# Patient Record
Sex: Female | Born: 1970 | Hispanic: Yes | Marital: Married | Smoking: Never smoker
Health system: Southern US, Community
[De-identification: ages and names within clinical notes are randomized; demographics above are authoritative.]

## PROBLEM LIST (undated history)

## (undated) ENCOUNTER — Inpatient Hospital Stay (HOSPITAL_COMMUNITY): Payer: Self-pay

## (undated) DIAGNOSIS — N946 Dysmenorrhea, unspecified: Secondary | ICD-10-CM

## (undated) DIAGNOSIS — O24419 Gestational diabetes mellitus in pregnancy, unspecified control: Secondary | ICD-10-CM

## (undated) DIAGNOSIS — I10 Essential (primary) hypertension: Secondary | ICD-10-CM

## (undated) DIAGNOSIS — E119 Type 2 diabetes mellitus without complications: Secondary | ICD-10-CM

## (undated) DIAGNOSIS — K219 Gastro-esophageal reflux disease without esophagitis: Secondary | ICD-10-CM

## (undated) HISTORY — DX: Essential (primary) hypertension: I10

## (undated) HISTORY — DX: Dysmenorrhea, unspecified: N94.6

## (undated) HISTORY — PX: DILATION AND CURETTAGE OF UTERUS: SHX78

## (undated) HISTORY — PX: ABDOMINAL SURGERY: SHX537

## (undated) HISTORY — PX: COSMETIC SURGERY: SHX468

## (undated) HISTORY — PX: OOPHORECTOMY: SHX86

## (undated) HISTORY — PX: HERNIA REPAIR: SHX51

---

## 1998-05-28 ENCOUNTER — Other Ambulatory Visit: Admission: RE | Admit: 1998-05-28 | Discharge: 1998-05-28 | Payer: Self-pay | Admitting: Gynecology

## 1998-09-22 ENCOUNTER — Other Ambulatory Visit: Admission: RE | Admit: 1998-09-22 | Discharge: 1998-09-22 | Payer: Self-pay | Admitting: Gynecology

## 1998-10-07 ENCOUNTER — Encounter: Admission: RE | Admit: 1998-10-07 | Discharge: 1999-01-05 | Payer: Self-pay | Admitting: Gynecology

## 1999-01-27 ENCOUNTER — Encounter: Admission: RE | Admit: 1999-01-27 | Discharge: 1999-04-27 | Payer: Self-pay | Admitting: Gynecology

## 1999-02-19 ENCOUNTER — Inpatient Hospital Stay (HOSPITAL_COMMUNITY): Admission: AD | Admit: 1999-02-19 | Discharge: 1999-02-19 | Payer: Self-pay | Admitting: Gynecology

## 1999-03-06 ENCOUNTER — Inpatient Hospital Stay (HOSPITAL_COMMUNITY): Admission: AD | Admit: 1999-03-06 | Discharge: 1999-03-06 | Payer: Self-pay | Admitting: Gynecology

## 1999-03-14 ENCOUNTER — Inpatient Hospital Stay (HOSPITAL_COMMUNITY): Admission: AD | Admit: 1999-03-14 | Discharge: 1999-03-14 | Payer: Self-pay | Admitting: Obstetrics and Gynecology

## 1999-04-02 ENCOUNTER — Encounter (HOSPITAL_COMMUNITY): Admission: RE | Admit: 1999-04-02 | Discharge: 1999-04-16 | Payer: Self-pay | Admitting: *Deleted

## 1999-04-15 ENCOUNTER — Inpatient Hospital Stay (HOSPITAL_COMMUNITY): Admission: AD | Admit: 1999-04-15 | Discharge: 1999-04-19 | Payer: Self-pay | Admitting: Gynecology

## 1999-04-15 ENCOUNTER — Encounter (INDEPENDENT_AMBULATORY_CARE_PROVIDER_SITE_OTHER): Payer: Self-pay | Admitting: Specialist

## 1999-04-20 ENCOUNTER — Encounter: Admission: RE | Admit: 1999-04-20 | Discharge: 1999-07-19 | Payer: Self-pay | Admitting: Gynecology

## 1999-04-26 ENCOUNTER — Inpatient Hospital Stay (HOSPITAL_COMMUNITY): Admission: AD | Admit: 1999-04-26 | Discharge: 1999-04-26 | Payer: Self-pay | Admitting: Gynecology

## 1999-05-25 ENCOUNTER — Other Ambulatory Visit: Admission: RE | Admit: 1999-05-25 | Discharge: 1999-05-25 | Payer: Self-pay | Admitting: Gynecology

## 2000-05-18 ENCOUNTER — Other Ambulatory Visit: Admission: RE | Admit: 2000-05-18 | Discharge: 2000-05-18 | Payer: Self-pay | Admitting: Gynecology

## 2001-01-29 ENCOUNTER — Encounter: Admission: RE | Admit: 2001-01-29 | Discharge: 2001-04-29 | Payer: Self-pay | Admitting: Gynecology

## 2001-04-02 ENCOUNTER — Observation Stay (HOSPITAL_COMMUNITY): Admission: AD | Admit: 2001-04-02 | Discharge: 2001-04-03 | Payer: Self-pay | Admitting: Gynecology

## 2001-04-05 ENCOUNTER — Inpatient Hospital Stay (HOSPITAL_COMMUNITY): Admission: AD | Admit: 2001-04-05 | Discharge: 2001-04-05 | Payer: Self-pay | Admitting: *Deleted

## 2001-04-15 ENCOUNTER — Inpatient Hospital Stay (HOSPITAL_COMMUNITY): Admission: AD | Admit: 2001-04-15 | Discharge: 2001-04-18 | Payer: Self-pay | Admitting: Gynecology

## 2001-04-15 ENCOUNTER — Encounter (INDEPENDENT_AMBULATORY_CARE_PROVIDER_SITE_OTHER): Payer: Self-pay | Admitting: Specialist

## 2001-04-19 ENCOUNTER — Encounter: Admission: RE | Admit: 2001-04-19 | Discharge: 2001-05-19 | Payer: Self-pay | Admitting: Gynecology

## 2001-05-20 ENCOUNTER — Encounter: Admission: RE | Admit: 2001-05-20 | Discharge: 2001-06-19 | Payer: Self-pay | Admitting: Gynecology

## 2001-05-28 ENCOUNTER — Other Ambulatory Visit: Admission: RE | Admit: 2001-05-28 | Discharge: 2001-05-28 | Payer: Self-pay | Admitting: Gynecology

## 2001-06-20 ENCOUNTER — Encounter: Admission: RE | Admit: 2001-06-20 | Discharge: 2001-07-20 | Payer: Self-pay | Admitting: Gynecology

## 2001-08-18 ENCOUNTER — Encounter: Admission: RE | Admit: 2001-08-18 | Discharge: 2001-09-17 | Payer: Self-pay | Admitting: Gynecology

## 2001-10-18 ENCOUNTER — Encounter: Admission: RE | Admit: 2001-10-18 | Discharge: 2001-11-17 | Payer: Self-pay | Admitting: Gynecology

## 2007-12-09 ENCOUNTER — Encounter: Admission: RE | Admit: 2007-12-09 | Discharge: 2007-12-09 | Payer: Self-pay | Admitting: Neurology

## 2007-12-19 ENCOUNTER — Encounter: Admission: RE | Admit: 2007-12-19 | Discharge: 2008-01-31 | Payer: Self-pay | Admitting: Neurology

## 2008-11-19 ENCOUNTER — Emergency Department (HOSPITAL_COMMUNITY): Admission: EM | Admit: 2008-11-19 | Discharge: 2008-11-20 | Payer: Self-pay | Admitting: Emergency Medicine

## 2010-05-31 ENCOUNTER — Encounter: Payer: Self-pay | Admitting: Neurology

## 2010-09-24 NOTE — Discharge Summary (Signed)
[facility]  Patient:    Debbie Hebert, Debbie Hebert Visit Number: [id number] MRN: [id number]          Service Type: OBS Location: 910A 9117 01 Attending Physician:  Merrily Pew Dictated by:   Antony Contras, [facility] Admit Date:  04/15/2001 Discharge Date: 04/18/2001                             Discharge Summary  DISCHARGE DIAGNOSES:          1. Intrauterine pregnancy at 34 weeks.                               2. Gestational diabetes, insulin-dependent.                               3. Preterm labor.                               4. Preterm rupture of membrane.                               5. Previous cesarean section/desires repeat.  HISTORY OF PRESENT ILLNESS:   The patient is a 40 year old, gravida 2, para 1-0-0-1, with an last menstrual period of May 25, 2001 by ultrasound. Prenatal risk factors include a history of a previous cesarean section, gestational diabetes requiring insulin management.  The patient does request repeat cesarean section.  LABORATORY DATA:              Blood type A positive.  Antibody screen negative.  RPR and HBACG nonreactive.  Rubella immuned.  HOSPITAL COURSE:              The patient presented on April 15, 2001 with spontaneous rupture of membranes at 34 weeks.  Since she wished to proceed with cesarean section delivery the procedure was performed by Dr. Nadyne Coombes. Fontaine and assisted by scrub technician.  Findings included normal female infant.  Apgars 7 and 8.  Weight was 8 pounds 6 ounces.  Nuchal cord x 1.  Normal pelvic anatomy.  The patient remained afebrile and had no difficulty voiding.  She was able to be discharged on her third postoperative day in satisfactory condition.  CBC showed a hemoglobin of 30.4, hemoglobin 10.6, platelets 173,000.  DISCHARGE INSTRUCTIONS:       Follow up in six weeks.  DISCHARGE MEDICATIONS:        Continue with prenatal vitamins and iron. Motrin and Tylox for  pain. Dictated by:   Antony Contras, [facility] Attending Physician:  Merrily Pew DD:  05/23/01 TD:  05/23/01 Job: [id number]

## 2010-09-24 NOTE — Op Note (Signed)
[facility]  Patient:    Debbie Hebert, Debbie Hebert Visit Number: [id number] MRN: [id number]          Service Type: OBS Location: *N Attending Physician:  Tonye Royalty Dictated by:   Nadyne Coombes. Fontaine, M.D. Proc. Date: 04/15/01                             Operative Report  PREOPERATIVE DIAGNOSES:       1. Pregnancy at 34 weeks.                               2. Gestational diabetes, insulin dependent.                               3. Preterm labor.                               4. Preterm rupture of the membranes.                               5. Prior cesarean section, desires repeat                                  cesarean section.  POSTOPERATIVE DIAGNOSES:      1. Pregnancy at 34 weeks.                               2. Gestational diabetes, insulin dependent.                               3. Preterm labor.                               4. Preterm rupture of the membranes.                               5. Prior cesarean section, desires repeat                                  cesarean section.  PROCEDURE:                    Low transverse cervical cesarean section.  SURGEON:                      Timothy P. Fontaine, M.D.  ASSISTANT:                    Scrub technician.  ANESTHESIA:                   Regional.  ESTIMATED BLOOD LOSS:         Less than 500 cc.  COMPLICATIONS:                None.  SPECIMEN:  Samples of cord blood and placenta.  FINDINGS:                     At 2217, normal female infant, Apgars 7 and 8, weight pending. Nuchal cord x 1 noted. Normal pelvic anatomy noted.  DESCRIPTION OF PROCEDURE:     The patient was taken to the operating room, underwent regional anesthesia, and placed in the left tilt supine position, received an abdominal preparation with Betadine scrub and Betadine solution, bladder emptied with an indwelling Foley catheterization, and the patient was draped in the usual fashion.  After assuring adequate anesthesia, abdomen was sharply entered through a repeat Pfannenstiel incision achieving adequate hemostasis at all levels. Bladder flap was sharply and bluntly developed without difficulty and the uterus was sharply entered in the lower uterine segment and bluntly extended laterally. The bulging membranes were ruptured and the fluid was was noted to be clear. The infants head was delivered through the incision. Nuchal cord x 1 was reduced. The nares and mouth were suctioned. The rest of the infant delivered, the cord was doubly clamped and cut, and the infant was handed to pediatrics in attendance. Samples of cord blood were obtained. The placenta was then spontaneously extruded and noted to be intact and was sent to pathology. The uterus was exteriorized. The endometrial cavity was explored with a sponge to remove all placental and membrane fragments. The patient received IV antibiotic prophylaxis at this time. The uterine incision was then closed in one layer using 0 Vicryl suture in a running interlocking stitch and several figure-of-eight interrupted sutures were then played to achieve ultimate hemostasis. The uterus was then returned to the abdomen which was copiously irrigated noting adequate hemostasis and the anterior fascia was then reapproximated using 0 Vicryl suture, starting at the angles and meeting in the middle. Subcutaneous tissues were irrigated, adequate hemostasis was achieved with electrocautery, and the skin was reapproximated with staples. A sterile dressing was applied. The patient was taken to the recovery room in good condition having tolerated the procedure well. Dictated by:   Nadyne Coombes. Fontaine, M.D. Attending Physician:  Tonye Royalty DD:  04/15/01 TD:  04/15/01 Job: [id number]

## 2010-09-24 NOTE — Op Note (Signed)
[facility]  Patient:    Debbie Hebert                        MRN: [id number] Proc. Date: 04/16/99 Adm. Date:  [id number] Attending:  Tonye Royalty                           Operative Report  PREOPERATIVE DIAGNOSIS:       Intrauterine pregnancy at [redacted] weeks gestation. Spontaneous rupture of membranes.  Gestational diabetes - insulin dependent. Meconium stained amniotic fluid.  Chorioamnionitis.  Fetal tachycardia with possible arrhythmia.  POSTOPERATIVE DIAGNOSIS:      Intrauterine pregnancy at [redacted] weeks gestation. Spontaneous rupture of membranes.  Gestational diabetes - insulin dependent. Meconium stained amniotic fluid.  Chorioamnionitis.  Fetal tachycardia with possible arrhythmia.  OPERATION:                    Primary low transverse cesarean section.  SURGEON:                      Rudy Jew. Ashley Royalty, M.D.  ASSISTANT:  ANESTHESIA:                   Epidural.  FINDINGS:                     8 pound 2 ounce female, Apgars 8 at one minute and 9 at five minutes sent to the newborn nursery.  ESTIMATED BLOOD LOSS:         600 cc.  COMPLICATIONS:                None.  PACKS AND DRAINS:             Foley.  COUNTS:                       Sponge, needle, and instrument counts were correct x 2.  DESCRIPTION OF PROCEDURE:     The patient was taken to the operating room and placed in the dorsal supine position.  She was prepped and draped in the usual sterile fashion for abdominal surgery.  Foley catheter had been previously placed. After verifying surgical levels of anesthesia, the Pfannenstiel incision was made down to the level of the fascia.  The fascia was nicked with the knife and incised transversely with Mayo scissors.  Underlying rectus muscles were separated from the fascia using sharp and blunt dissection.  The rectus muscles were separated in he midline exposing the peritoneum which was elevated and entered  atraumatically with Metzenbaum scissors.  The incision was extended longitudinally.  The uterus was  identified and a bladder flap created by incising the anterior uterine serosa. The bladder was held inferiorly with the bladder blade out of harms way.  The uterus was then entered through a low transverse incision using sharp and blunt dissection.  The fluid was noted to be meconium stained as had previously been noted.  The infant was delivered from a vertex presentation.  At delivery of the head, DeLee suction was used to suction the airway.  Next the body was delivered and the cord was triply clamped, cut, and the infant given immediately to the awaiting pediatrics team.  Arterial cord pH was obtained from an isolated segment. Regular cord blood was obtained.  The placenta and membranes were removed in their entirety and submitted  to pathology for histologic studies.  The uterus was exteriorized.  The uterus was then closed in two running layers of #1 Vicryl. he first was a running locking layer.  The second was a running, intermittently locking, and imbricating layer.  Hemostasis was noted.  The uterus, tubes, and ovaries were found to be normal and returned to the abdominal cavity.  Copious irrigation was accomplished.  Next, the fascia was closed with 0 Vicryl in a running fashion.  The skin was closed with staples.  The patient tolerated the procedure extremely well and was returned to the recovery room in good condition. DD:  04/16/99 TD:  04/18/99 Job: [id number]

## 2010-09-24 NOTE — Discharge Summary (Signed)
[facility]  Patient:    JOHNITA, PALLESCHI Visit Number: [id number] MRN: [id number]          Service Type: OBS Location: 910A 9117 01 Attending Physician:  Merrily Pew Dictated by:   Antony Contras, [facility] Admit Date:  04/15/2001 Discharge Date: 04/18/2001                             Discharge Summary  DISCHARGE DIAGNOSES:          1. Intrauterine pregnancy at 32 weeks.                               2. History of gestational diabetes.                               3. Shortened cervix.                               4. Preterm labor.  HISTORY OF PRESENT ILLNESS:   The patient is a gravida 1 with [facility] March 25, 2002, estimated gestational age of [redacted] weeks with gestational diabetes and shortened cervix. The patient presented on April 02, 2001 for cervical length ultrasound and also nonstress test and was noted to be having some contractions. Apparently, she had much had much fluid to drink. She was admitted for observation and management of preterm labor symptoms.  HOSPITAL COURSE/TREATMENT:    The patient was admitted and was given subcutaneous terbutaline to which she responded. She was noted to have decrease of the contractions. was placed on Procardia 20 mg q.4h., and was able to be discharged on satisfactory condition on April 03, 2001.  DISPOSITION:                  The patient is to follow up in the office for next office visit. Dictated by:   Antony Contras, [facility] Attending Physician:  Merrily Pew DD:  06/15/01 TD:  06/17/01 Job: [phone] [id number]

## 2010-09-24 NOTE — H&P (Signed)
[facility]  Patient:    Debbie Hebert, Debbie Hebert Visit Number: [id number] MRN: [id number]          Service Type: OBS Location: *N Attending Physician:  Tonye Royalty Dictated by:   Nadyne Coombes. Fontaine, M.D. Adm. Date:  04/15/01                           History and Physical  CHIEF COMPLAINT:              Contractions.  HISTORY OF PRESENT ILLNESS:   A 40 year old G40, P55 female at [redacted] weeks gestation with history of preterm labor, currently on nifedipine, as well as gestational diabetes, insulin requiring and a prior low transverse cervical cesarean section, who plans on repeat cesarean section.  Patient notes since the a.m. of admission increasing regular contractions for which she ultimately presented to triage the evening of admission.  The patient was found to be contracting on a regular basis, every 3-5 minutes with a reactive fetal tracing with pelvic exam showing the internal cervical os to be closed; external os to be a fingertip dilated.  Patient received her expected nifedipine 20 mg dose, as well as IV hydration and she continued to contract with increasing frequency and discomfort and, ultimately, was admitted for IV tocolysis.  PAST MEDICAL HISTORY:         Insulin-dependent gestational diabetes, requiring p.m. NPH insulin at 12 units and 5 units of regular in the morning.  PAST SURGICAL HISTORY:        Cesarean section in 2000.  REVIEW OF SYSTEMS:            Noncontributory.  SOCIAL HISTORY:               Noncontributory.  FAMILY HISTORY:               Noncontributory.  ALLERGIES:                    None.  CURRENT MEDICATIONS:          1. Insulin.                               2. Prenatal vitamins.  PHYSICAL EXAMINATION:  HEENT:                        Normal.  LUNGS:                        Clear.  CARDIOVASCULAR:               Regular rate without rubs, murmurs or gallops.  ABDOMEN:                      Gravid,  vertex.  Uterus consistent with dates. Reactive fetal tracing on external monitor with regular uterine contractions every 3-5 minutes.  PELVIC EXAM:                  Deferred per nursing.  ASSESSMENT:                   1. [age]-year-old G2, P1 female, [redacted] weeks  gestation.                               2. Insulin-dependent gestational diabetes.                               3. History of preterm labor, now with regular                                  contractions.                               4. History of prior cesarean section with                                  planned repeat.                               5. For admission and IV tocolysis.  PLAN:                         Given the patients insulin-dependent diabetes, feel it most prudent to go ahead and tocolyse as she has failed IV hydration, as well as nifedipine oral dose.  CBC, urine analysis, beta strep vaginal culture ordered and pending at time of this dictation. Dictated by:   Nadyne Coombes. Fontaine, M.D. Attending Physician:  Tonye Royalty DD:  04/15/01 TD:  04/15/01 Job: [phone] [id number]

## 2012-04-12 ENCOUNTER — Other Ambulatory Visit (HOSPITAL_COMMUNITY)
Admission: RE | Admit: 2012-04-12 | Discharge: 2012-04-12 | Disposition: A | Discharge status: Home / Self Care | Payer: Self-pay | Source: Ambulatory Visit | Attending: Gynecology | Admitting: Gynecology

## 2012-04-12 ENCOUNTER — Encounter: Payer: Self-pay | Admitting: Gynecology

## 2012-04-12 ENCOUNTER — Ambulatory Visit (INDEPENDENT_AMBULATORY_CARE_PROVIDER_SITE_OTHER): Payer: Self-pay | Admitting: Gynecology

## 2012-04-12 ENCOUNTER — Ambulatory Visit: Payer: Self-pay | Admitting: Gynecology

## 2012-04-12 VITALS — BP 128/84 | Ht 61.25 in | Wt 185.0 lb

## 2012-04-12 DIAGNOSIS — Z01419 Encounter for gynecological examination (general) (routine) without abnormal findings: Secondary | ICD-10-CM

## 2012-04-12 DIAGNOSIS — Z1151 Encounter for screening for human papillomavirus (HPV): Secondary | ICD-10-CM | POA: Insufficient documentation

## 2012-04-12 DIAGNOSIS — N949 Unspecified condition associated with female genital organs and menstrual cycle: Secondary | ICD-10-CM

## 2012-04-12 DIAGNOSIS — R635 Abnormal weight gain: Secondary | ICD-10-CM

## 2012-04-12 DIAGNOSIS — R102 Pelvic and perineal pain: Secondary | ICD-10-CM

## 2012-04-12 DIAGNOSIS — I10 Essential (primary) hypertension: Secondary | ICD-10-CM | POA: Insufficient documentation

## 2012-04-12 DIAGNOSIS — N938 Other specified abnormal uterine and vaginal bleeding: Secondary | ICD-10-CM

## 2012-04-12 DIAGNOSIS — N925 Other specified irregular menstruation: Secondary | ICD-10-CM

## 2012-04-12 MED ORDER — MEDROXYPROGESTERONE ACETATE 10 MG PO TABS: ORAL_TABLET | ORAL | Status: DC

## 2012-04-12 NOTE — Progress Notes (Signed)
Debbie Hebert [birth year] [id number]   History:    41 y.o.  for annual gyn exam who has not been seen in the office in several years. Patient stated she had a Pap smear done 2 years ago in [place] was normal. She also stated that she had a laparotomy with right salpingo-oophorectomy for a benign adnexal mass. Patient would know prior history of mammograms. She in frequently does her self breast examination. Patient declined flu vaccine today. Her primary physician is Dr. Jordan Hawks who is been following her for hypertension and did her lab work recently. She stated that her cycles are regular but the past 2 months she spotted like for 1 day and between both cycles. Her spouse is using condoms for contraception. She's also been complaining of some left lower quadrant discomfort on and off for the past several months.  Past medical history,surgical history, family history and social history were all reviewed and documented in the EPIC chart.  Gynecologic History Patient's last menstrual period was 03/26/2012. Contraception: condoms Last Pap: 2 years ago in [place]. Results were: No report available patient states negative Last mammogram: No prior study. Results were: No prior study  Obstetric History OB History    Grav Para Term Preterm Abortions TAB SAB Ect Mult Living   2 2 1 1      2      # Outc Date GA Lbr Len/2nd Wgt Sex Del Anes PTL Lv   1 TRM     F CS  No Yes   2 PRE     M CS  Yes Yes       ROS: A ROS was performed and pertinent positives and negatives are included in the history.  GENERAL: No fevers or chills. HEENT: No change in vision, no earache, sore throat or sinus congestion. NECK: No pain or stiffness. CARDIOVASCULAR: No chest pain or pressure. No palpitations. PULMONARY: No shortness of breath, cough or wheeze. GASTROINTESTINAL: No abdominal pain, nausea, vomiting or diarrhea, melena or bright red blood per rectum. GENITOURINARY: No urinary frequency, urgency, hesitancy or dysuria.  MUSCULOSKELETAL: No joint or muscle pain, no back pain, no recent trauma. DERMATOLOGIC: No rash, no itching, no lesions. ENDOCRINE: No polyuria, polydipsia, no heat or cold intolerance. No recent change in weight. HEMATOLOGICAL: No anemia or easy bruising or bleeding. NEUROLOGIC: No headache, seizures, numbness, tingling or weakness. PSYCHIATRIC: No depression, no loss of interest in normal activity or change in sleep pattern.     Exam: chaperone present  BP 128/84  Ht 5' 1.25" (1.556 m)  Wt 185 lb (83.915 kg)  BMI 34.67 kg/m2  LMP 03/26/2012  Body mass index is 34.67 kg/(m^2).  General appearance : Well developed well nourished female. No acute distress HEENT: Neck supple, trachea midline, no carotid bruits, no thyroidmegaly Lungs: Clear to auscultation, no rhonchi or wheezes, or rib retractions  Heart: Regular rate and rhythm, no murmurs or gallops Breast:Examined in sitting and supine position were symmetrical in appearance, no palpable masses or tenderness,  no skin retraction, no nipple inversion, no nipple discharge, no skin discoloration, no axillary or supraclavicular lymphadenopathy Abdomen: no palpable masses or tenderness, no rebound or guarding Extremities: no edema or skin discoloration or tenderness  Pelvic:  Bartholin, Urethra, Skene Glands: Within normal limits             Vagina: No gross lesions or discharge  Cervix: No gross lesions or discharge  Uterus limited exam due to patient's abdominal girth and complaining of left lower quadrant  discomfort  Adnexa  as described above  Anus and perineum  normal   Rectovaginal  normal sphincter tone without palpated masses or tenderness             Hemoccult not done     Assessment/Plan:  41 y.o. female for annual exam be scheduled to return back to the office for a pelvic ultrasound for better assessment of her left adnexa. She will be started on Provera 10 mg to take 1 by mouth daily for 10 days of each month for the next  3 months and then to return to the office for followup in 4 months. She will maintain menstrual calendar. If she continues to have any form of irregular bleeding we'll proceed with an endometrial biopsy. A requisition was given to her to schedule her mammogram. Literature formation on self breast examination was provided in Spanish. Patient declined flu vaccine today. Pap smear was done today.    Ok Edwards MD, 5:29 PM 04/12/2012

## 2012-04-12 NOTE — Patient Instructions (Addendum)
Autoexamen de [employer]  Chief Executive Officer) El autoexamen de mamas puede detectar problemas de manera temprana, prevenir complicaciones mdicas significativas y posiblemente salvar su vida. Al hacerlo, podr familiarizarse con el aspecto y forma de sus [place], y observar cambios. Esto le permite descubrir cambios de manera precoz. Este autoexamen [employer] ofrece la tranquilidad de que sus senos estn en buen [place] de [place]. Una forma de aprender qu es normal para sus mamas y si sufren modificaciones es Radio producer un autoexamen.  Si encuentra un bulto o algo que no estaba presente anteriormente, lo mejor es ponerse en contacto con su mdico inmediatamente. Otro hallazgo que deben ser evaluados por su mdico es la secrecin del pezn, especialmente si es con sangre, cambios en la piel o enrojecimiento; reas donde la piel parece estar tironeada (retrada) o nuevos bultos o protuberancias. El dolor en los senos es rara vez se asocia con el cncermalignidad), pero tambin debe ser evaluado por un mdico. .  AUTOEXAMEN DE MAMAS  El mejor momento para examinar sus mamas es a los 5 a 7 das despus de finalizado el perodo menstrual. Durante la menstruacin, las mamas estn ms abultadas y puede haber ms dificultad para Clinical research associate modificaciones. Si no menstra, ha llegado a la menopausia, o le han extirpado el tero (histerectomia), usted debe examinar sus senos a intervalos regulares, por ejemplo cada mes. Si est amamantando, examine sus senos despus de alimentar al beb o despus de usar un extractor de Novelty. Los implantes mamarios no disminuyen el riesgo de bultos o tumores, por lo que debe seguir realizando el autoexamen de [employer] se recomienda. Hable con su mdico acerca de cmo determinar la diferencia entre el implante y el tejido [place]. Adems, debe consultar cuanta presin debe hacer durante el examen. Con el tiempo se familiarizar con las variaciones de las mamas y se sentir ms cmoda para  Horticulturist, commercial. Para el autoexamen deber quitarse toda la ropa de la cintura para [place].   Observe sus senos y pezones. Prese frente a un espejo en una habitacin con buena iluminacin. Con las [employer] caderas, presione las manos firmemente [place]. Busque diferencias en la forma, el contorno y el tamao de un pecho al otro (asimetras).Eusebio Me las asimetras se incluyen arrugas, depresiones o protuberancias. Tambin, busque cambios en la piel, como reas enrojecidas o escamosas. Busque cambios en los pezones, como secreciones, hoyuelos, cambios en la posicin, o enrojecimiento.  [place] cuidadosamente sus senos. Es mucho mejor [employer] en la ducha o en la baera, [place] jabn o cuando est recostada sobre su espalda. Coloque el brazo (en el lado de la mama que se examina) por arriba de la cabeza. Use las yemas (no las puntas) de los tres dedos centrales de la mano opuesta para palpar. Comience en la zona de la axila, haga crculos de  de pulgada (2 cm) y vaya superponindolos. Utilice 3 niveles diferentes de presin (ligero, medio y [place]) en cada crculo antes de pasar al siguiente. Se necesita una presin ligera para sentir los tejidos ms cercanos a la piel. La presin media ayudar a sentir el tejido [employer] un poco ms profundo, mientras que se necesita una presin firme para palpar el tejido que se encuentra cerca de las Markesan. Continuar superponiendo crculos y vaya hacia abajo, hasta sentir las Clutier, por debajo del [place]. Luego mueva un espacio del ancho de un dedo hacia el centro del cuerpo. Siga con los crculos del  de pulgada (2 cm) mientras va lentamente  hacia la clavcula, cerca de la base del cuello. Contine con el examen hacia arriba y hacia abajo con las 3 intensidades de presin Civil Service fast streamer a la mitad del pecho. Hgalo con cada seno cuidadosamente, buscando bultos o modificaciones.  Debe llevar un registro escrito con los cambios o los hallazgos normales que  encuentre para cada seno. Si registra esta informacin, no tiene que depender slo de la memoria para Designer, industrial/product, la sensibilidad o la ubicacin de los [place]. Anote en qu momento se encuentra del ciclo menstrual, si usted todava est menstruando.  El tejido [employer] puede tener algunos bultos o tejidos engrosados. Sin embargo, consulte a su mdico si usted Animal nutritionist.   SOLICITE ATENCIN MDICA SI:   Observa cambios en la forma, en el contorno o el tamao de las mamas o los pezones.   Hay modificaciones en la piel, como zonas enrojecidas o escamosas en las mamas o en los pezones.   Tiene una secrecin anormal en los pezones.   Siente un nuevo bulto o reas engrosadas de [place] anormal.  Document Released: 04/25/2005 Document Revised: 10/25/2011 [facility] Patient Information 2013 [place], [place].  Ejercicios para perder peso (Exercise to Lose Weight) La actividad fsica y Neomia Dear dieta saludable ayudan a perder peso. El mdico podr sugerirle ejercicios especficos. IDEAS Y CONSEJOS PARA HACER EJERCICIOS  Elija opciones econmicas que disfrute hacer , como caminar, andar en bicicleta o los vdeos para ejercitarse.   Utilice las Microbiologist del ascensor.   Camine durante la hora del almuerzo.   Estacione el auto lejos del lugar de [place] o [place].   Concurra a un gimnasio o tome clases de gimnasia.   Comience con 5  10 minutos de actividad fsica por da. Ejercite hasta 30 minutos, 4 a 6 das por [address]   Utilice zapatos que tengan un buen soporte y ropas cmodas.   Elongue antes y despus de Company secretary.   Ejercite hasta que aumente la respiracin y el corazn palpite rpido.   Beba agua extra cuando ejercite.   No haga ejercicio Firefighter, sentirse mareado o que le falte mucho el aire.  La actividad fsica puede quemar alrededor de 150 caloras.  Correr 20 cuadras en 15 minutos.   Jugar vley durante 45 a 60 minutos.    Limpiar y encerar el auto durante 45 a 60 minutos.   Jugar ftbol americano de toque.   Caminar 25 cuadras en 35 minutos.   Empujar un cochecito 20 cuadras en 30 minutos.   Jugar baloncesto durante 30 minutos.   Rastrillar hojas secas durante 30 minutos.   Andar en bicicleta 80 cuadras en 30 minutos.   Caminar 30 cuadras en 30 minutos.   Bailar durante 30 minutos.   Quitar la nieve con una pala durante 15 minutos.   Nadar vigorosamente durante 20 minutos.   Subir escaleras durante 15 minutos.   Andar en bicicleta 60 cuadras durante 15 minutos.   Arreglar el jardn entre 30 y 45 minutos.   Saltar a la soga durante 15 minutos.   Limpiar vidrios o pisos durante 45 a 60 minutos.  Document Released: 07/30/2010 Document Revised: 01/05/2011 [facility] Patient Information 2012 [place], [place].                                                   Control  del colesterol  Los niveles de colesterol en el organismo estn determinados significativamente por su dieta. Los niveles de colesterol tambin se relacionan con la enfermedad cardaca. El material que sigue ayuda a Software engineer relacin y a Chiropractor qu puede hacer para mantener su corazn sano. No todo el colesterol es [place]. Las lipoprotenas de baja densidad (LDL) forman el colesterol "malo". El colesterol malo puede ocasionar depsitos de grasa que se acumulan en el interior de las arterias. Las lipoprotenas de alta densidad (HDL) es el colesterol "bueno". Ayuda a remover el colesterol LDL "malo" de la [place]. El colesterol es un factor de riesgo muy importante para la enfermedad cardaca. Otros factores de riesgo son la hipertensin arterial, el hbito de fumar, el estrs, la herencia y [place].  El msculo cardaco obtiene el suministro de sangre a travs de las arterias coronarias. Si su colesterol LDL ("malo") est elevado y el HDL ("bueno") es bajo, tiene un factor de riesgo para que se formen depsitos de Holiday representative en las arterias  coronarias (los vasos sanguneos que suministran sangre al corazn). Esto hace que haya menos lugar para que la sangre circule. Sin la suficiente sangre y oxgeno, el msculo cardaco no puede funcionar correctamente, y usted podr sentir dolores en el pecho (angina pectoris). Cuando una arteria coronaria se cierra completamente, una parte del msculo cardaco puede morir (infarto de miocardio). CONTROL DEL COLESTEROL Cuando el profesional que lo asiste enva la sangre al laboratorio para Artist nivel de colesterol, puede realizarle tambin un perfil completo de los lpidos. Con esta prueba, se puede determinar la cantidad total de colesterol, as como los niveles de LDL y HDL. Los triglicridos son un tipo de grasa que circula en la sangre y que tambin puede utilizarse para determinar el riesgo de enfermedad cardaca. En la siguiente tabla se establecen los nmeros ideales: Prueba: Colesterol total  Menos de 200 mg/dl.  Prueba: LDL "colesterol malo"  Menos de 100 mg/dl.   Menos de 70 mg/dl si tiene riesgo muy elevado de sufrir un ataque cardaco o muerte cardaca sbita.  Prueba: HDL "colesterol bueno"  Mujeres: Ms de 50 mg/dl.   Hombres: Ms de 40 mg/dl.  Prueba: Trigliceridos  Menos de 150 mg/dl.  CONTROL DEL COLESTEROL CON DIETA Aunque factores como el ejercicio y el estilo de vida son importantes, la "primera lnea de ataque" es la dieta. Esto se debe a que se sabe que ciertos alimentos hacen subir el colesterol y otros lo [place]. El objetivo debe ser [employer] alimentos, de modo que tengan un efecto sobre el colesterol y, an ms importante, Microbiologist las grasas saturadas y trans con otros tipos de grasas, como las monoinsaturadas y las poliinsaturadas y cidos grasos omega-3 . En promedio, una persona no debe consumir ms de 15 a 17 g de grasas saturadas por [employer]. Las grasas saturadas y trans se consideran grasas "malas", ya que elevan el colesterol LDL. Las grasas saturadas se  encuentran principalmente en productos animales como carne, [place] y crema. Pero esto no significa que usted Marketing executive todas sus comidas favoritas. Actualmente, como lo muestra el cuadro que figura al final de este documento, hay sustitutos de buen sabor, bajos en grasas y en colesterol, para la mayora de los alimentos que a usted Musician. Elija aquellos alimentos alternativos que sean bajos en grasas o sin grasas. Elija cortes de carne del cuarto trasero o lomo ya que estos cortes son los que tienen menor cantidad de grasa y Oncologist. El pollo (  sin piel), el pescado, la carne de ternera, y la [place] molida son excelentes opciones. Elimine las carnes [employer] o el salami. Los Federal[employer]Mogul o nada de grasas saturadas. Cuando consuma carne [place], carne de aves de corral, o pescado, hgalo en porciones de 85 gramos (3 onzas). Las grasas trans tambin se llaman "aceites parcialmente hidrogenados". Son aceites manipulados cientficamente de [place] que son slidos a Publishing rights manager, tienen una larga vida y Glass blower/designer sabor y la textura de los alimentos a los que se Scientist, clinical (histocompatibility and immunogenetics). Las grasas trans se encuentran en la [place], [place], crackers y alimentos horneados.  Para hornear y cocinar, el aceite es un excelente sustituto para la [place]. Los aceites monoinsaturados tienen un beneficio particular, ya que se cree que disminuyen el colesterol LDL (colesterol malo) y elevan el HDL. Deber evitar los aceites tropicales saturados como el de coco y el de [place].  Recuerde, adems, que puede comer sin restricciones los grupos de alimentos que son naturalmente libres de grasas saturadas y Neurosurgeon trans, entre los que se incluyen el pescado, las frutas (excepto el [place]), verduras, frijoles, cereales (cebada, arroz, [place], trigo) y las pastas (sin salsas con crema)  IDENTIFIQUE LOS ALIMENTOS QUE DISMINUYEN EL COLESTEROL  Pueden disminuir el colesterol las fibras  solubles que estn en las frutas, como las [place], en los vegetales como el brcoli, las patatas y las zanahorias; en las legumbres como frijoles, guisantes y Therapist, occupational; y en los cereales como la cebada. Los alimentos fortificados con fitosteroles tambin Engineer, production. Debe consumir al menos 2 g de estos alimentos a diario para Financial planner de disminucin de [place].  En el supermercado, lea las etiquetas de los envases para identificar los alimentos bajos en grasas saturadas, libres de grasas trans y bajos en [place], . Elija quesos que tengan solo de 2 a 3 g de grasa saturada por onza (28,35 g). Use una margarina que no dae el corazn, [place] de grasas trans o aceite parcialmente hidrogenado. Al comprar alimentos horneados (galletitas dulces y Gaffer) evite el aceite parcialmente hidrogenado. Los panes y bollos debern ser de granos enteros (harina de maz o de avena entera, en lugar de "harina" o "harina enriquecida"). Compre sopas en lata que no sean cremosas, con bajo contenido de sal y sin grasas adicionadas.  TCNICAS DE PREPARACIN DE LOS ALIMENTOS  Nunca fra los alimentos en aceite abundante. Si debe frer, hgalo en poco aceite y removiendo [place], porque as se utilizan muy pocas grasas, o utilice un spray antiadherente. Cuando le sea posible, hierva, hornee o ase las carnes y cocine los vegetales al vapor. En vez de [employer] con mantequilla o [place], utilice limn y hierbas, pur de Psychologist, educational y canela (para las calabazas y batatas), yogurt y salsa descremados y aderezos para ensaladas bajos en contenido graso.  BAJO EN GRASAS SATURADAS / SUSTITUTOS BAJOS EN GRASA  Carnes / Grasas saturadas (g)  Evite: Bife, corte graso (3 oz/85 g) / 11 g   Elija: Bife, corte magro (3 oz/85 g) / 4 g   Evite: Hamburguesa (3 oz/85 g) / 7 g   Elija:  [place]amb[place] magra (3 oz/85 g) / 5 g   Evite: Jamn (3 oz/85 g) / 6 g   Elija:  Jamn magro (3 oz/85 g) /  2.4 g   Evite: Pollo, con piel (3 oz/85 g), Carne oscura / 4 g   Elija:  Pollo, sin piel (3 oz/85 g), Carne oscura / 2  g   Evite: Pollo, con piel (3 oz/85 g), Carne magra / 2.5 g   Elija: Pollo, sin piel (3 oz/85 g), Carne magra / 1 g  Lcteos / Grasas saturadas (g)  Evite: Leche entera (1 taza) / 5 g   Elija: Leche con bajo contenido de grasa, 2% (1 taza) / 3 g   Elija: Leche con bajo contenido de grasa, 1% (1 taza) / 1.5 g   Elija: Leche descremada (1 taza) / 0.3 g   Evite: Queso duro (1 oz/28 g) / 6 g   Elija: Queso descremado (1 oz/28 g) / 2-3 g   Evite: Queso cottage, 4% grasa (1 taza)/ 6.5 g   Elija: Queso cottage con bajo contenido de grasa, 1% grasa (1 taza)/ 1.5 g   Evite: Helado (1 taza) / 9 g   Elija: Sorbete (1 taza) / 2.5 g   Elija: Yogurt helado sin contenido de grasa (1 taza) / 0.3 g   Elija: Barras de fruta congeladas / vestigios   Evite: Crema batida (1 cucharada) / 3.5 g   Elija: Batidos glac sin lcteos (1 cucharada) / 1 g  Condimentos / Grasas saturadas (g)  Evite: Mayonesa (1 cucharada) / 2 g   Elija: Mayonesa con bajo contenido de grasa (1 cucharada) / 1 g   Evite: Manteca (1 cucharada) / 7 g   Elija: Margarina extra light (1 cucharada) / 1 g   Evite: Aceite de coco (1 cucharada) / 11.8 g   Elija: Aceite de oliva (1 cucharada) / 1.8 g   Elija: Aceite de maz (1 cucharada) / 1.7 g   Elija: Aceite de crtamo (1 cucharada) / 1.2 g   Elija: Aceite de girasol (1 cucharada) / 1.4 g   Elija: Aceite de soja (1 cucharada) / 2.4 g   Elija: Aceite de canola (1 cucharada) / 1 g  Document Released: 04/25/2005 Document Revised: 01/05/2011 [facility] Patient Information 2012 [place], [place].  Ecografa transvaginal (Transvaginal Ultrasound) La ecografa transvaginal es una ecografa plvica en la que se utiliza una probeta metlica que se coloca en la vagina, para observar los rganos femeninos. El ecgrafo enva ondas sonoras desde un  transductor (sonda). Estas ondas sonoras chocan contra las estructuras del cuerpo (como un eco) y crean Naval architect. La imagen se observa en un monitor. Se denomina transvaginal debido a que la sonda se inserta dentro de la vagina. Puede haber una pequea molestia por la introduccin de la sonda. Esta prueba tambin puede realizarse [employer]. La ecografa endovaginal es otro nombre para la ecografa transvaginal. En una ecografa transabdominal, la sonda se coloca en la parte externa del abdomen. Este mtodo no ofrece imgenes tan buenas como la tcnica transvaginal. La ecogafa transvaginal se utiliza para observar alteraciones en el tracto genital femenino. Entre ellos se incluyen:  Problemas de infertilidad.  Malformaciones congnitas (defecto de nacimiento) del tero y los ovarios.  Tumores en el tero.  Hemorragias anormales.  Tumores y quistes de ovario.  Abscesos (tejidos inflamados y pus) en la pelvis.  Dolor abdominal o plvico sin causa aparente.  Infecciones plvicas. DURANTE EL EMBARAZO, SE UTILIZA PAR OBSERVAR:  Embarazos normales.  Un embarazo ectpico (embarazo fuera del tero).  Latidos cardacos fetales.  Anormalidades de la pelvis que no se observan bien con la ecografa transabdominal.  Sospecha de gemelos o embarazo mltiple.  Aborto inminente.  Problemas en el cuello del tero (cuello incompetente, no permanece cerrado para contener al beb).  Cuando se Mearl Latin amniocentesis (  se retira lquido de la bolsa amnitica, para ser [place]).  Al buscar anormalidades en el beb.  Para controlar el crecimiento, el desarrollo y la edad del feto.  Para medir la cantidad de lquido en el saco amnitico.  Cuando se realiza una versin externa del beb (se lo mueve a Loss adjuster, chartered).  Evaluar al beb en embarazos de alto riesgo (perfil biofsico).  Si se sospecha el deceso del beb (muerte). En algunos casos, se utiliza un mtodo especial  denominado ecografa con infusin salina, para una observacin ms precisa del tero. Se inyecta solucin salina (agua con sal) dentro del tero en pacientes no embarazadas para observar mejor su interior. Este mtodo no se Glass blower/designer. La probeta tambin puede usarse para obtener biopsias de Insurance claims handler, para drenar lquido de quistes de ovario y para Editor, commissioning un DIU (dispositivo intrauterino para el control de la natalidad) que no pueda [place]. PREPARACIN PARA LA PRUEBA La ecografa transvaginal se realiza con la vejiga vaca. La ecografa transabdominal se realiza con la vejiga llena. Podrn solicitarle que beba varios vasos de agua antes del examen. En algunos casos se realiza una ecografa transabdominal antes de la ecografa transvaginal para obervar los rganos del abdomen. PROCEDIMIENTO  Deber acostarse en una cama, con las rodillas dobladas y los pies en los estribos. La probeta se cubre con un condn. Dentro de la vagina y en la probeta se aplica un lubricante estril. El lubricante ayuda a transmitir las ondas sonoras y Nature conservation officer la irritacin de la vagina. El mdico mover la sonda en el interior de la cavidad vaginal para escanear las estructuras plvicas. Un examen normal mostrar una pelvis normal y contenidos normales en su interior. Una prueba anormal mostrar anormalidades en la pelvis, la placenta o el beb. LAS CAUSAS DE UN RESULTADO ANORMAL PUEDEN SER:  Crecimientos o tumores en:  El tero.  Los ovarios.  La vagina.  Otras estructuras plvicas.  Crecimientos no cancerosos en el tero y los ovarios.  El ovario se retuerce y se corta el suministro de [place] (torsin [place]).  Las reas de infeccin incluyen:  Enfermedad inflamatoria plvica.  Un absceso en la pelvis.  Ubicacin de un DIU. LOS PROBLEMAS QUE PUEDEN HALLARSE EN UNA MUJER EMBARAZADA SON:  Embarazo ectpico (embarazo fuera del tero).  Embarazos mltiples.  Dilatacin (apertura)  precoz anormal del cuello del tero. Esto puede indicar un cuello incompetente y Surveyor, mining.  Aborto inminente.  Muerte fetal.  Los problemas con la placenta incluyen:  La placenta se ha desarrollado sobre la abertura del cuello del tero (placenta previa).  La placenta se ha separado anticipadamente en el tero (abrupcin placentaria).  La placenta se desarrolla en el msculo del tero (placenta acreta).  Tumores del Psychiatrist, incluyendo la enfermedad trofoblstica gestacional. Se trata de un embarazo anormal en el que no hay feto. El tero se llena de quistes similares a uvas que en algunos casos son cancerosos.  Posicin incorrecta del feto (de nalgas, de vrtice).  Retraso del desarrollo fetal intrauterino (escaso desarrollo en el tero).  Anormalidades o infeccin fetal. RIESGOS Y COMPLICACIONES No hay riesgos conocidos para la ecografa. No se toman radiografas cuando se realiza una ecografa. Document Released: 08/11/2008 Document Revised: 07/18/2011 [facility] Patient Information 2013 [place], [place].

## 2012-04-13 ENCOUNTER — Ambulatory Visit: Payer: Self-pay | Admitting: Internal Medicine

## 2012-04-13 VITALS — BP 105/71 | HR 86 | Temp 98.0°F | Resp 17 | Wt 183.0 lb

## 2012-04-13 DIAGNOSIS — K529 Noninfective gastroenteritis and colitis, unspecified: Secondary | ICD-10-CM

## 2012-04-13 DIAGNOSIS — R109 Unspecified abdominal pain: Secondary | ICD-10-CM

## 2012-04-13 DIAGNOSIS — R1110 Vomiting, unspecified: Secondary | ICD-10-CM

## 2012-04-13 DIAGNOSIS — R1013 Epigastric pain: Secondary | ICD-10-CM

## 2012-04-13 DIAGNOSIS — A09 Infectious gastroenteritis and colitis, unspecified: Secondary | ICD-10-CM

## 2012-04-13 LAB — POCT CBC
Granulocyte percent: 82.3 % — AB (ref 37–80)
HCT, POC: 44.5 % (ref 37.7–47.9)
Hemoglobin: 13.9 g/dL (ref 12.2–16.2)
Lymph, poc: 1.1 (ref 0.6–3.4)
MCH, POC: 30.6 pg (ref 27–31.2)
MCHC: 31.2 g/dL — AB (ref 31.8–35.4)
MCV: 98 fL — AB (ref 80–97)
MID (cbc): 0.4 (ref 0–0.9)
MPV: 10.4 fL (ref 0–99.8)
POC Granulocyte: 6.9 (ref 2–6.9)
POC LYMPH PERCENT: 12.9 % (ref 10–50)
POC MID %: 4.8 % (ref 0–12)
Platelet Count, POC: 206 10*3/uL (ref 142–424)
RBC: 4.54 M/uL (ref 4.04–5.48)
RDW, POC: 12.6 %
WBC: 8.4 10*3/uL (ref 4.6–10.2)

## 2012-04-13 LAB — POCT URINALYSIS DIPSTICK
Bilirubin, UA: NEGATIVE
Blood, UA: NEGATIVE
Glucose, UA: NEGATIVE
Ketones, UA: NEGATIVE
Leukocytes, UA: NEGATIVE
Nitrite, UA: NEGATIVE
Spec Grav, UA: >=1.03
Urobilinogen, UA: 0.2
pH, UA: 6

## 2012-04-13 LAB — POCT UA - MICROSCOPIC ONLY
Bacteria, U Microscopic: NEGATIVE
Casts, Ur, LPF, POC: NEGATIVE
Crystals, Ur, HPF, POC: NEGATIVE
Mucus, UA: NEGATIVE
RBC, urine, microscopic: NEGATIVE
Yeast, UA: NEGATIVE

## 2012-04-13 MED ORDER — HYDROCODONE-ACETAMINOPHEN 5-325 MG PO TABS: ORAL_TABLET | ORAL | Status: DC

## 2012-04-13 MED ORDER — ONDANSETRON HCL 8 MG PO TABS: 8.0000 mg | ORAL_TABLET | Freq: Three times a day (TID) | ORAL | Status: DC | PRN

## 2012-04-13 NOTE — Patient Instructions (Addendum)
Viral Gastroenteritis Viral gastroenteritis is also known as stomach flu. This condition affects the stomach and intestinal tract. It can cause sudden diarrhea and vomiting. The illness typically lasts 3 to 8 days. Most people develop an immune response that eventually gets rid of the virus. While this natural response develops, the virus can make you quite ill. CAUSES  Many different viruses can cause gastroenteritis, such as rotavirus or noroviruses. You can catch one of these viruses by consuming contaminated food or water. You may also catch a virus by sharing utensils or other personal items with an infected person or by touching a contaminated surface. SYMPTOMS  The most common symptoms are diarrhea and vomiting. These problems can cause a severe loss of body fluids (dehydration) and a body salt (electrolyte) imbalance. Other symptoms may include:  Fever.  Headache.  Fatigue.  Abdominal pain. DIAGNOSIS  Your caregiver can usually diagnose viral gastroenteritis based on your symptoms and a physical exam. A stool sample may also be taken to test for the presence of viruses or other infections. TREATMENT  This illness typically goes away on its own. Treatments are aimed at rehydration. The most serious cases of viral gastroenteritis involve vomiting so severely that you are not able to keep fluids down. In these cases, fluids must be given through an intravenous line (IV). HOME CARE INSTRUCTIONS   Drink enough fluids to keep your urine clear or pale yellow. Drink small amounts of fluids frequently and increase the amounts as tolerated.  Ask your caregiver for specific rehydration instructions.  Avoid:  Foods high in sugar.  Alcohol.  Carbonated drinks.  Tobacco.  Juice.  Caffeine drinks.  Extremely hot or cold fluids.  Fatty, greasy foods.  Too much intake of anything at one time.  Dairy products until 24 to 48 hours after diarrhea stops.  You may consume probiotics.  Probiotics are active cultures of beneficial bacteria. They may lessen the amount and number of diarrheal stools in adults. Probiotics can be found in yogurt with active cultures and in supplements.  Wash your hands well to avoid spreading the virus.  Only take over-the-counter or prescription medicines for pain, discomfort, or fever as directed by your caregiver. Do not give aspirin to children. Antidiarrheal medicines are not recommended.  Ask your caregiver if you should continue to take your regular prescribed and over-the-counter medicines.  Keep all follow-up appointments as directed by your caregiver. SEEK IMMEDIATE MEDICAL CARE IF:   You are unable to keep fluids down.  You do not urinate at least once every 6 to 8 hours.  You develop shortness of breath.  You notice blood in your stool or vomit. This may look like coffee grounds.  You have abdominal pain that increases or is concentrated in one small area (localized).  You have persistent vomiting or diarrhea.  You have a fever.  The patient is a child younger than 3 months, and he or she has a fever.  The patient is a child older than 3 months, and he or she has a fever and persistent symptoms.  The patient is a child older than 3 months, and he or she has a fever and symptoms suddenly get worse.  The patient is a baby, and he or she has no tears when crying. MAKE SURE YOU:   Understand these instructions.  Will watch your condition.  Will get help right away if you are not doing well or get worse. Document Released: 04/25/2005 Document Revised: 07/18/2011 Document Reviewed: 02/09/2011   ExitCare Patient Information 2013 [place], [place]. Gastroenteritis viral (Viral Gastroenteritis) La gastroenteritis viral tambin es conocida como gripe del [place]. Este trastorno [employer] y el tubo digestivo. Puede causar diarrea y vmitos repentinos. La enfermedad generalmente dura entre 3 y [address]. La [employer] de las  personas desarrolla una respuesta inmunolgica. Con el tiempo, esto elimina el virus. Mientras se desarrolla esta respuesta natural, el virus puede afectar en forma importante su salud.  CAUSAS Muchos virus diferentes pueden causar gastroenteritis, por ejemplo el rotavirus o el norovirus. Estos virus pueden contagiarse al consumir alimentos o agua contaminados. Tambin puede contagiarse al compartir utensilios u otros artculos personales con una persona infectada o al tocar una superficie contaminada.  SNTOMAS Los sntomas ms comunes son diarrea y vmitos. Estos problemas pueden causar una prdida grave de lquidos corporales(deshidratacin) y un desequilibrio de sales corporales(electrolitos). Otros sntomas pueden ser:   Grant Ruts.  Dolor de [place].  Fatiga.  Dolor abdominal. DIAGNSTICO  El mdico podr hacer el diagnstico de gastroenteritis viral basndose en los sntomas y el examen fsico Tambin pueden tomarle una muestra de materia fecal para diagnosticar la presencia de virus u otras infecciones.  TRATAMIENTO Esta enfermedad generalmente desaparece sin tratamiento. Los tratamientos estn dirigidos a Social research officer, government. Los casos ms graves de gastroenteritis viral implican vmitos tan intensos que no es posible retener lquidos. En [employer], los lquidos deben administrarse a travs de una va intravenosa (IV).  INSTRUCCIONES PARA EL CUIDADO DOMICILIARIO  Beba suficientes lquidos para mantener la orina clara o de color amarillo plido. Beba pequeas cantidades de lquido con frecuencia y aumente la cantidad segn la tolerancia.  Pida instrucciones especficas a su mdico con respecto a la rehidratacin.  Evite:  Alimentos que Nurse, adult.  Alcohol.  Gaseosas.  TabacoVista Lawman.  Bebidas con cafena.  Lquidos muy calientes o fros.  Alimentos muy grasos.  Comer demasiado a Licensed conveyancer.  Productos lcteos hasta 24 a 48 horas despus de que se detenga la  diarrea.  Puede consumir probiticos. Los probiticos son cultivos activos de bacterias beneficiosas. Pueden disminuir la cantidad y el nmero de deposiciones diarreicas en el adulto. Se encuentran en los yogures con cultivos activos y en los suplementos.  Lave bien sus manos para evitar que se disemine el virus.  Slo tome medicamentos de venta libre o recetados para Primary school teacher, las molestias o bajar la fiebre segn las indicaciones de su mdico. No administre aspirina a los nios. Los medicamentos antidiarreicos no son recomendables.  Consulte a su mdico si puede seguir tomando sus medicamentos recetados o de [employer].  Cumpla con todas las visitas de control, segn le indique su mdico. SOLICITE ATENCIN MDICA DE INMEDIATO SI:  No puede retener lquidos.  No hay emisin de orina durante 6 a 8 horas.  Le falta el aire.  Observa sangre en el vmito (se ve como caf molido) o en la materia fecal.  Siente dolor abdominal que empeora o se concentra en una zona pequea (se localiza).  Tiene nuseas o vmitos persistentes.  Tiene fiebre.  El paciente es un nio menor de 3 meses y [place].  El paciente es un nio mayor de 3 meses, tiene fiebre y sntomas persistentes.  El paciente es un nio mayor de 3 meses y tiene fiebre y sntomas que empeoran repentinamente.  El paciente es un beb y no tiene lgrimas cuando llora. ASEGRESE QUE:   Comprende estas instrucciones.  Controlar su enfermedad.  Solicitar ayuda inmediatamente si no  mejora o si empeora. Document Released: 04/25/2005 Document Revised: 07/18/2011 [facility] Patient Information 2013 [place], [place].

## 2012-04-13 NOTE — Progress Notes (Signed)
  Subjective:    Patient ID: Debbie Hebert, female    DOB: 02-09-1971, 41 y.o.   MRN: [id number]  HPI Vomiting started about 2pm today, diarrhea followed and has watery diarrhea every 30 min. No blood seen   Review of Systems     Objective:   Physical Exam  Vitals reviewed. Constitutional: She is oriented to person, place, and time. She appears well-nourished.  HENT:  Right Ear: External ear normal.  Left Ear: External ear normal.  Mouth/Throat: Oropharynx is clear and moist.  Cardiovascular: Normal rate, regular rhythm and normal heart sounds.   Pulmonary/Chest: Effort normal.  Abdominal: She exhibits no distension. Bowel sounds are decreased. There is no hepatosplenomegaly. There is tenderness in the right upper quadrant and right lower quadrant. There is no rigidity, no rebound, no guarding and no CVA tenderness.    Neurological: She is alert and oriented to person, place, and time. She exhibits normal muscle tone. Coordination normal.  Skin: Skin is warm and dry.  Psychiatric: She has a normal mood and affect.   BP lying 129/84 pulse 82 BP standing 118/79 pulse 98   Results for orders placed in visit on 04/13/12  POCT URINALYSIS DIPSTICK      Component Value Range   Color, UA yellow     Clarity, UA clear     Glucose, UA neg     Bilirubin, UA neg     Ketones, UA neg     Spec Grav, UA >=1.030     Blood, UA neg     pH, UA 6.0     Protein, UA trace     Urobilinogen, UA 0.2     Nitrite, UA neg     Leukocytes, UA Negative    POCT UA - MICROSCOPIC ONLY      Component Value Range   WBC, Ur, HPF, POC 0-1     RBC, urine, microscopic neg     Bacteria, U Microscopic neg     Mucus, UA neg     Epithelial cells, urine per micros 3-6     Crystals, Ur, HPF, POC neg     Casts, Ur, LPF, POC neg     Yeast, UA neg    POCT CBC      Component Value Range   WBC 8.4  4.6 - 10.2 K/uL   Lymph, poc 1.1  0.6 - 3.4   POC LYMPH PERCENT 12.9  10 - 50 %L   MID (cbc) 0.4  0 - 0.9   POC MID % 4.8  0 - 12 %M   POC Granulocyte 6.9  2 - 6.9   Granulocyte percent 82.3 (*) 37 - 80 %G   RBC 4.54  4.04 - 5.48 M/uL   Hemoglobin 13.9  12.2 - 16.2 g/dL   HCT, POC 40.9  81.1 - 47.9 %   MCV 98.0 (*) 80 - 97 fL   MCH, POC 30.6  27 - 31.2 pg   MCHC 31.2 (*) 31.8 - 35.4 g/dL   RDW, POC 91.4     Platelet Count, POC 206  142 - 424 K/uL   MPV 10.4  0 - 99.8 fL        Assessment & Plan:  Hold metformin, phenterimine, and hctz until you are well Diarrhea care/Hydrate Zofran 8mg  po now/vicodin 5/325 1/2 prn Recheck 1 day prn

## 2012-04-14 LAB — COMPREHENSIVE METABOLIC PANEL
ALT: 38 U/L — ABNORMAL HIGH (ref 0–35)
AST: 19 U/L (ref 0–37)
Albumin: 4.1 g/dL (ref 3.5–5.2)
Alkaline Phosphatase: 45 U/L (ref 39–117)
BUN: 16 mg/dL (ref 6–23)
CO2: 24 meq/L (ref 19–32)
Calcium: 8.8 mg/dL (ref 8.4–10.5)
Chloride: 101 meq/L (ref 96–112)
Creat: 0.89 mg/dL (ref 0.50–1.10)
Glucose, Bld: 108 mg/dL — ABNORMAL HIGH (ref 70–99)
Potassium: 3.6 meq/L (ref 3.5–5.3)
Sodium: 139 meq/L (ref 135–145)
Total Bilirubin: 1.1 mg/dL (ref 0.3–1.2)
Total Protein: 6.7 g/dL (ref 6.0–8.3)

## 2012-04-14 LAB — LIPASE: Lipase: 18 U/L (ref 0–75)

## 2012-04-27 ENCOUNTER — Encounter: Payer: Self-pay | Admitting: Gynecology

## 2012-04-27 ENCOUNTER — Ambulatory Visit (INDEPENDENT_AMBULATORY_CARE_PROVIDER_SITE_OTHER): Payer: Self-pay | Admitting: Gynecology

## 2012-04-27 ENCOUNTER — Ambulatory Visit (INDEPENDENT_AMBULATORY_CARE_PROVIDER_SITE_OTHER): Payer: Self-pay

## 2012-04-27 VITALS — BP 128/84

## 2012-04-27 DIAGNOSIS — R102 Pelvic and perineal pain: Secondary | ICD-10-CM

## 2012-04-27 DIAGNOSIS — N949 Unspecified condition associated with female genital organs and menstrual cycle: Secondary | ICD-10-CM

## 2012-04-27 DIAGNOSIS — N938 Other specified abnormal uterine and vaginal bleeding: Secondary | ICD-10-CM

## 2012-04-27 DIAGNOSIS — N925 Other specified irregular menstruation: Secondary | ICD-10-CM

## 2012-04-27 NOTE — Progress Notes (Signed)
Patient presented to the office for followup. She was seen the office on December 5 for her annual exam. She had mentioned that she had had a laparotomy and she thought it was her right tube and ovary that was removed for a benign adnexal mass. She slightly overweight so pelvic examination was somewhat difficult and had been complaining of low pelvic discomfort on and off for several months.  Ultrasound today: Uterus measures 7.7 x 4.9 cm with endometrial stripe of 5.3 mm. Normal right ovary. Absence of left ovary. No free fluid seen.   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 Due to patient being overweight she is sometime late on her menses. I prescribed her Provera 10 mg to take daily for 10 days of the month for the next 3 months and to maintain menstrual calendar. When she finishes if she continues to have any form of irregular bleeding she will return to the office for further evaluation such as with an endometrial biopsy. Recent Pap smear normal.

## 2012-07-26 ENCOUNTER — Ambulatory Visit: Payer: Self-pay | Admitting: Gynecology

## 2013-05-23 ENCOUNTER — Ambulatory Visit: Payer: Self-pay | Admitting: Family Medicine

## 2013-05-23 VITALS — BP 126/82 | HR 83 | Temp 98.4°F | Resp 16 | Ht 62.0 in | Wt 187.8 lb

## 2013-05-23 DIAGNOSIS — R519 Headache, unspecified: Secondary | ICD-10-CM

## 2013-05-23 DIAGNOSIS — R599 Enlarged lymph nodes, unspecified: Secondary | ICD-10-CM

## 2013-05-23 DIAGNOSIS — R51 Headache: Secondary | ICD-10-CM

## 2013-05-23 DIAGNOSIS — M542 Cervicalgia: Secondary | ICD-10-CM

## 2013-05-23 DIAGNOSIS — R590 Localized enlarged lymph nodes: Secondary | ICD-10-CM

## 2013-05-23 MED ORDER — DICLOFENAC SODIUM 75 MG PO TBEC: DELAYED_RELEASE_TABLET | ORAL | Status: DC

## 2013-05-23 MED ORDER — AMOXICILLIN 875 MG PO TABS: 875.0000 mg | ORAL_TABLET | Freq: Two times a day (BID) | ORAL | Status: DC

## 2013-05-23 NOTE — Patient Instructions (Signed)
Take the antibiotic, amoxicillin, one twice daily at breakfast and supper  Take the anti-inflammatory medicine, diclofenac, one twice daily at breakfast and supper  Return if not improving

## 2013-05-23 NOTE — Progress Notes (Signed)
Subjective: Patient is here complaining of pain in the left side of her neck for the past 4 days. Knows of no injury. She does not work. It's been hurting on the left lateral neck below the ear. Also some into the left side of the face up toward the cheek bone. She also has had some nausea. No fevers.  Objective: Pleasant lady in no major distress. TMs normal. Eyes PERRLA. No rashes. Face nontender. No tenderness of the sinuses. No TMJ pain or tenderness. Neck supple. Has a 1 CM node slightly tender on the left side of the neck behind the sternocleidomastoid muscle. Chest clear. Heart regular without murmurs. Abdomen soft without mass or tenderness  Assessment: Full viral infections infecting the lymph node. However will go ahead and cover with some antibiotics. If she is doing worse she is to return for recheck. No labs done today.

## 2014-03-23 ENCOUNTER — Ambulatory Visit (INDEPENDENT_AMBULATORY_CARE_PROVIDER_SITE_OTHER): Payer: Self-pay | Admitting: Family Medicine

## 2014-03-23 VITALS — BP 115/83 | HR 97 | Temp 98.5°F | Resp 16 | Ht 61.5 in | Wt 185.0 lb

## 2014-03-23 DIAGNOSIS — R5383 Other fatigue: Secondary | ICD-10-CM

## 2014-03-23 DIAGNOSIS — R109 Unspecified abdominal pain: Secondary | ICD-10-CM

## 2014-03-23 LAB — POCT URINALYSIS DIPSTICK
Bilirubin, UA: NEGATIVE
Blood, UA: NEGATIVE
Glucose, UA: NEGATIVE
Leukocytes, UA: NEGATIVE
Nitrite, UA: NEGATIVE
Protein, UA: NEGATIVE
Spec Grav, UA: 1.025
Urobilinogen, UA: 0.2
pH, UA: 5.5

## 2014-03-23 LAB — POCT CBC
Granulocyte percent: 56.6 % (ref 37–80)
HCT, POC: 47.7 % (ref 37.7–47.9)
Hemoglobin: 15.8 g/dL (ref 12.2–16.2)
Lymph, poc: 3.4 (ref 0.6–3.4)
MCH, POC: 31.8 pg — AB (ref 27–31.2)
MCHC: 33.2 g/dL (ref 31.8–35.4)
MCV: 95.9 fL (ref 80–97)
MID (cbc): 0.4 (ref 0–0.9)
MPV: 9.1 fL (ref 0–99.8)
POC Granulocyte: 5 (ref 2–6.9)
POC LYMPH PERCENT: 38.4 % (ref 10–50)
POC MID %: 5 % (ref 0–12)
Platelet Count, POC: 276 10*3/uL (ref 142–424)
RBC: 4.98 M/uL (ref 4.04–5.48)
RDW, POC: 12.8 %
WBC: 8.9 10*3/uL (ref 4.6–10.2)

## 2014-03-23 LAB — POCT UA - MICROSCOPIC ONLY
Casts, Ur, LPF, POC: NEGATIVE
Crystals, Ur, HPF, POC: NEGATIVE
Mucus, UA: NEGATIVE
Yeast, UA: NEGATIVE

## 2014-03-23 NOTE — Progress Notes (Signed)
Subjective:    Patient ID: Debbie Hebert, female    DOB: [birth year], 43 y.o.   MRN: [id number]  HPI Chief Complaint  Patient presents with   Fatigue    pain all over, nausea x 2 wks.    This chart was scribed for Debbie SidleKurt Lauenstein, MD by Andrew Auaven Small, ED Scribe. This patient was seen in room 3 and the patient's care was started at 2:59 PM.  HPI Comments: Debbie BankerBlanca E Hebert is a [age] y.o. female who presents to the [facility] complaining of fatigue onset 2 weeks. Pt reports associated nausea, abdominal pain, aches in legs, and HA's. She reports every time she tries to do house work she becomes tired. Pt reports she began feeling when she started taking phentermine and topamax which she was taking to lose weight. Pt's last menses ended 6 days ago. Pt denies fever, chills, CP, palpitation, and otalgia  Past Medical History  Diagnosis Date   Hypertension    Dysmenorrhea    No Known Allergies Prior to Admission medications   Medication Sig Start Date End Date Taking? Authorizing Provider  metFORMIN (GLUCOPHAGE) 500 MG tablet Take 500 mg by mouth 2 (two) times daily with a meal.   Yes Historical Provider, MD  phentermine 37.5 MG capsule Take 37.5 mg by mouth every morning.   Yes Historical Provider, MD  topiramate (TOPAMAX) 100 MG tablet Take 100 mg by mouth daily.   Yes Historical Provider, MD  diclofenac (VOLTAREN) 75 MG EC tablet Take one twice daily with food for pain and inflammation in neck 05/23/13   Debbie Najjaravid H Hopper, MD  hydrochlorothiazide (MICROZIDE) 12.5 MG capsule Take 12.5 mg by mouth daily.    Historical Provider, MD  medroxyPROGESTERone (PROVERA) 10 MG tablet Take one tablet daily for 10 days of each month for 3 months 04/12/12   Ok EdwardsJuan H Fernandez, MD   Review of Systems  Constitutional: Negative for fever and chills.  HENT: Negative for ear pain.   Cardiovascular: Negative for chest pain and palpitations.  Gastrointestinal: Positive for nausea and  abdominal pain.  Musculoskeletal: Positive for myalgias.  Neurological: Positive for headaches.    Objective:   Physical Exam  Constitutional: She is oriented to person, place, and time. She appears well-developed and well-nourished. No distress.  HENT:  Head: Normocephalic and atraumatic.  Right Ear: External ear normal.  Left Ear: External ear normal.  Eyes: Conjunctivae and EOM are normal.  Neck: Neck supple.  Cardiovascular: Normal rate.   Pulmonary/Chest: Effort normal.  Abdominal: There is tenderness ( left mid abdomen).  Musculoskeletal: Normal range of motion.  Neurological: She is alert and oriented to person, place, and time.  Skin: Skin is warm and dry.  Psychiatric: She has a normal mood and affect. Her behavior is normal.  Nursing note and vitals reviewed.  Results for orders placed or performed in visit on 03/23/14  POCT UA - Microscopic Only  Result Value Ref Range   WBC, Ur, HPF, POC 0-2    RBC, urine, microscopic 0-1    Bacteria, U Microscopic trace    Mucus, UA neg    Epithelial cells, urine per micros 0-2    Crystals, Ur, HPF, POC neg    Casts, Ur, LPF, POC neg    Yeast, UA neg   POCT urinalysis dipstick  Result Value Ref Range   Color, UA yellow    Clarity, UA clear    Glucose, UA neg    Bilirubin, UA neg  Ketones, UA trace    Spec Grav, UA 1.025    Blood, UA neg    pH, UA 5.5    Protein, UA neg    Urobilinogen, UA 0.2    Nitrite, UA neg    Leukocytes, UA Negative   POCT CBC  Result Value Ref Range   WBC 8.9 4.6 - 10.2 K/uL   Lymph, poc 3.4 0.6 - 3.4   POC LYMPH PERCENT 38.4 10 - 50 %L   MID (cbc) 0.4 0 - 0.9   POC MID % 5.0 0 - 12 %M   POC Granulocyte 5.0 2 - 6.9   Granulocyte percent 56.6 37 - 80 %G   RBC 4.98 4.04 - 5.48 M/uL   Hemoglobin 15.8 12.2 - 16.2 g/dL   HCT, POC 16.147.7 09.637.7 - 47.9 %   MCV 95.9 80 - 97 fL   MCH, POC 31.8 (A) 27 - 31.2 pg   MCHC 33.2 31.8 - 35.4 g/dL   RDW, POC 04.512.8 %   Platelet Count, POC 276 142 - 424 K/uL    MPV 9.1 0 - 99.8 fL    Assessment & Plan:    I personally performed the services described in this documentation, which was scribed in my presence. The recorded information has been reviewed and is accurate.  This has all the hallmarks of side effects from her recent weight loss program. I've asked her to stop taking these.  Signed, Sheila OatsKurt Hebert M.D.

## 2014-03-23 NOTE — Patient Instructions (Signed)
Fatiga (Fatigue) Fatiga es la sensacin de cansancio, falta de energa, falta de motivacin, o sensacin permanente de Cytogeneticistagotamiento. Si descansa lo suficiente, se alimenta bien y reduce las situaciones de estrs, Archivistdisminuir la fatiga. Consulte con su mdico si esto persiste. La naturaleza de su fatiga indicar a su mdico cul es la causa. El tratamiento se implementa segn cul sea la causa.  CAUSAS Hay muchas causas de fatiga. La mayora de las veces puede hallarse en uno o ms de sus hbitos o rutinas. [place]Gran parte de las causas de fatiga pueden incluirse en una o ms de tres reas generales: Ellas son: Problemas en el estilo de vida  Trastornos del sueo.  Trabajar demasiado.  Agotamiento fsico.  Hbitos no saludables.  Malos hbitos alimenticios o trastornos de alimentacin.  Uso de alcohol y/o droga.  Su problema es un affecto secondario de los remedios nuevos.  Se nesesita parar ellos hoy!  Falta de nutricin adecuada (desnutricin). Problemas psiclogos  Problemas de estrs y/o ansiedad.  Depresin.  Duelo.  Aburrimiento. Trastornos o problemas mdicos  Anemia.  Embarazo.  Problemas en la glndula tiroides.  Recuperacin de Enbridge Energyuna ciruga mayor.  Dolores continuos.  Enfisema o asma no controladas adecuadamente.  Problemas alrgicos.  Diabetes.  Infecciones (como la mononucleosis).  Obesidad.  Trastornos del sueo, como apnea del sueo.  Insuficiencia cardiaca u otros problemas relacionados con el corazn.  Cncer.  Enfermedad del rin.  Enfermedad heptica.  Efectos de ciertos Colgate Palmolivemedicamentos como los antihistamnicos, medicamentos para la tos y el resfro, analgsicos prescriptos, medicamentos para la hipertensin arterial y Insurance underwriterel corazn, medicamentos utilizados para el tratamiento de cncer y algunos antidepresivos. SNTOMAS Los sntomas de fatiga son:   Harrel LemonFalta de Engineer, drillingenerga.  Falta de motivacin.  Somnolencia.  Sensacin de indiferencia Special educational needs teacherhacia el  entorno. DIAGNSTICO Los detalles de cmo usted siente guan a su mdico para Research officer, trade uniondiagnosticar la fatiga. Le preguntar sobre su estado de salud presente y pasado. Esto es importante para revisar todos los medicamentos que usted toma, incluyendo los recetados y los no recetados. Le realizar un examen fsico exhaustivo. Le preguntar acerca de sus sentimientos, hbitos y estilo de vida normal. Su mdico puede indicar anlisis de [place], de [place] u otras pruebas para buscar las causas ms comunes de la fatiga.  TRATAMIENTO La fatiga se trata corrigiendo la causa subyacente. Por ejemplo, si usted tiene dolor continuo o depresin, el tratamiento de estas causas mejorar el problema. De mismo modo, al ajustar la dosis de ciertos medicamentos ayudar a Community education officerreducir la fatiga.  INSTRUCCIONES PARA EL CUIDADO DOMICILIARIO  Trate de dormir lo suficiente todas las noches.  Mantenga una dieta sana y Indianutritiva, y beba suficiente agua durante Medical laboratory scientific officerel da.  Practique modos de relajarse (como el yoga o la meditacin).  Haga ejercicios regularmente.  Haga planes para cambiar las situaciones que causan estrs. Haga esos planes de ArvinMeritormanera que las tensiones disminuyan a lo largo del Sultanatiempo. Mantenga su rutina personal y laboral dentro de lmites razonables.  Evite las drogas de la calle y minimice el consumo de alcohol.  Comience a tomar un multivitamnico diario tras consultar a su mdico. SOLICITE ATENCIN MDICA SI:  Sufre un cansancio persistente, que no puede describir.  Tiene fiebre.  Pierde peso de Kanopolismanera involuntaria.  Sufre dolores de Turk[place]istancabeza.  Sufre trastornos de Toys 'R' Ussueo durante la noche.  Se siente triste.  Sufre constipacin.  Tiene la piel seca.  ArvinMeritorHa ganado peso.  Toma algn medicamento nuevo o diferente y sospecha que le causa fatiga.  No puede  dormir por la noche.  Observa hinchazn inusual en sus piernas u otras partes del cuerpo. SOLICITE ATENCIN MDICA SI:  Se siente confundido.  Su  visin es borrosa.  Sufre mareos o se desmaya.  Sufre un dolor de cabeza intenso.  Sufre un dolor abdominal, plvico o de espalda intensos.  Siente dolor en el pecho, le falta el aire o tiene un ritmo cardaco irregular o rpido.  No puede orinar normalmente.  Tiene una hemorragia anormal, como sangrado del recto o vomita sangre.  Tiene ideas de suicidio o de Corporate investment bankerhacerse dao.  Est preocupado porque podra perjudicar a alguien ms. ASEGRESE QUE:   Comprende estas instrucciones.  Controlar su enfermedad.  Solicitar ayuda de inmediato si no mejora o si empeora. Document Released: 08/11/2008 Document Revised: 07/18/2011 [facility] Patient Information 2015 [place], [place]. This information is not intended to replace advice given to you by your health care provider. Make sure you discuss any questions you have with your health care provider.

## 2014-07-13 ENCOUNTER — Ambulatory Visit (INDEPENDENT_AMBULATORY_CARE_PROVIDER_SITE_OTHER): Payer: Self-pay | Admitting: Emergency Medicine

## 2014-07-13 VITALS — BP 130/80 | HR 71 | Temp 97.9°F | Ht 62.0 in | Wt 192.5 lb

## 2014-07-13 DIAGNOSIS — J0140 Acute pansinusitis, unspecified: Secondary | ICD-10-CM

## 2014-07-13 DIAGNOSIS — S46811A Strain of other muscles, fascia and tendons at shoulder and upper arm level, right arm, initial encounter: Secondary | ICD-10-CM

## 2014-07-13 MED ORDER — CYCLOBENZAPRINE HCL 10 MG PO TABS: 10.0000 mg | ORAL_TABLET | Freq: Three times a day (TID) | ORAL | Status: DC | PRN

## 2014-07-13 MED ORDER — PSEUDOEPHEDRINE-GUAIFENESIN ER 60-600 MG PO TB12: 1.0000 | ORAL_TABLET | Freq: Two times a day (BID) | ORAL | Status: DC

## 2014-07-13 MED ORDER — AMOXICILLIN-POT CLAVULANATE 875-125 MG PO TABS: 1.0000 | ORAL_TABLET | Freq: Two times a day (BID) | ORAL | Status: DC

## 2014-07-13 MED ORDER — NAPROXEN SODIUM 550 MG PO TABS: 550.0000 mg | ORAL_TABLET | Freq: Two times a day (BID) | ORAL | Status: DC

## 2014-07-13 NOTE — Progress Notes (Signed)
[facility] [address], [place] [address] [phone]  Date:  07/13/2014   Name:  Debbie Hebert   DOB:  [birth year]   MRN:  [id number]  PCP:  Jonny RuizLlibre, Giovanni, MD    Chief Complaint: Ear Pain; Shoulder Pain; Headache; and Fatigue   History of Present Illness:  Debbie Hebert is a 44 y.o. very pleasant female patient who presents with the following:  Patient has numerous complaints.  Has 1 week history of pain in right shoulder. No history of injury or overuse. Pain into right neck and right ear.   Says pain in forehead.  No nasal congestion or drainae No sore throat or cough No wheezing or shortness of breath No radiation of pain or neuro symptoms in right arm. No chest pain  No nausea or vomiting No fever or chills Worse with use of right arm and less with rest No improvement with over the counter medications or other home remedies.  Denies other complaint or health concern today.   Patient Active Problem List   Diagnosis Date Noted  . HTN (hypertension) 04/12/2012  . Weight gain 04/12/2012  . DUB (dysfunctional uterine bleeding) 04/12/2012  . Pelvic pain in female 04/12/2012    Past Medical History  Diagnosis Date  . Hypertension   . Dysmenorrhea     Past Surgical History  Procedure Laterality Date  . Cesarean section      X2  . Abdominal surgery      LIPOSUCTION  . Oophorectomy      [place]  . Cosmetic surgery      History  Substance Use Topics  . Smoking status: Never Smoker   . Smokeless tobacco: Never Used  . Alcohol Use: No    Family History  Problem Relation Age of Onset  . Diabetes Mother   . Hypertension Sister   . Hypertension Paternal Grandmother     No Known Allergies  Medication list has been reviewed and updated.  Current Outpatient Prescriptions on File Prior to Visit  Medication Sig Dispense Refill  . hydrochlorothiazide (MICROZIDE) 12.5 MG capsule Take 12.5 mg by mouth  daily.    . metFORMIN (GLUCOPHAGE) 500 MG tablet Take 500 mg by mouth 2 (two) times daily with a meal.     No current facility-administered medications on file prior to visit.    Review of Systems:  As per HPI, otherwise negative.    Physical Examination: Filed Vitals:   07/13/14 1134  BP: 130/80  Pulse: 71  Temp: 97.9 F (36.6 C)   Filed Vitals:   07/13/14 1134  Height: 5\' 2"  (1.575 m)  Weight: 192 lb 8 oz (87.317 kg)   Body mass index is 35.2 kg/(m^2). Ideal Body Weight: Weight in (lb) to have BMI = 25: 136.4  GEN: WDWN, NAD, Non-toxic, A & O x 3 HEENT: Atraumatic, Normocephalic. Neck supple. No masses, No LAD. Ears and Nose: No external deformity.  Purulent nasal drainage CV: RRR, No M/G/R. No JVD. No thrill. No extra heart sounds. PULM: CTA B, no wheezes, crackles, rhonchi. No retractions. No resp. distress. No accessory muscle use. ABD: S, NT, ND, +BS. No rebound. No HSM. EXTR: No c/c/e NEURO Normal gait.  PSYCH: Normally interactive. Conversant. Not depressed or anxious appearing.  Calm demeanor.  Tender right trapezius with full PROM.  Assessment and Plan: Sinusitis augmentin mucinex Trapezius strain Anaprox Flexeril  Signed,  Phillips OdorJeffery Natasha Burda, MD

## 2014-07-13 NOTE — Patient Instructions (Signed)

## 2014-09-24 ENCOUNTER — Ambulatory Visit (INDEPENDENT_AMBULATORY_CARE_PROVIDER_SITE_OTHER): Payer: Self-pay | Admitting: Family Medicine

## 2014-09-24 VITALS — BP 128/82 | HR 64 | Temp 98.4°F | Resp 16 | Ht 62.0 in | Wt 196.0 lb

## 2014-09-24 DIAGNOSIS — F330 Major depressive disorder, recurrent, mild: Secondary | ICD-10-CM

## 2014-09-24 DIAGNOSIS — R1084 Generalized abdominal pain: Secondary | ICD-10-CM

## 2014-09-24 DIAGNOSIS — R140 Abdominal distension (gaseous): Secondary | ICD-10-CM

## 2014-09-24 DIAGNOSIS — R1030 Lower abdominal pain, unspecified: Secondary | ICD-10-CM

## 2014-09-24 DIAGNOSIS — R5383 Other fatigue: Secondary | ICD-10-CM

## 2014-09-24 DIAGNOSIS — K5900 Constipation, unspecified: Secondary | ICD-10-CM

## 2014-09-24 DIAGNOSIS — R112 Nausea with vomiting, unspecified: Secondary | ICD-10-CM

## 2014-09-24 LAB — POCT CBC
Granulocyte percent: 50.4 % (ref 37–80)
HCT, POC: 39.4 % (ref 37.7–47.9)
Hemoglobin: 13.2 g/dL (ref 12.2–16.2)
Lymph, poc: 3.6 — AB (ref 0.6–3.4)
MCH, POC: 31.3 pg — AB (ref 27–31.2)
MCHC: 33.6 g/dL (ref 31.8–35.4)
MCV: 93.1 fL (ref 80–97)
MID (cbc): 0.7 (ref 0–0.9)
MPV: 8.5 fL (ref 0–99.8)
POC Granulocyte: 4.4 (ref 2–6.9)
POC LYMPH PERCENT: 41.8 % (ref 10–50)
POC MID %: 7.8 % (ref 0–12)
Platelet Count, POC: 242 10*3/uL (ref 142–424)
RBC: 4.23 M/uL (ref 4.04–5.48)
RDW, POC: 12.8 %
WBC: 8.7 10*3/uL (ref 4.6–10.2)

## 2014-09-24 LAB — POCT URINALYSIS DIPSTICK
Bilirubin, UA: NEGATIVE
Blood, UA: NEGATIVE
Glucose, UA: NEGATIVE
Ketones, UA: NEGATIVE
Leukocytes, UA: NEGATIVE
Nitrite, UA: NEGATIVE
Protein, UA: NEGATIVE
Spec Grav, UA: 1.025
Urobilinogen, UA: 0.2
pH, UA: 7

## 2014-09-24 LAB — POCT UA - MICROSCOPIC ONLY
Casts, Ur, LPF, POC: NEGATIVE
Crystals, Ur, HPF, POC: NEGATIVE
Mucus, UA: NEGATIVE
Yeast, UA: NEGATIVE

## 2014-09-24 LAB — POCT URINE PREGNANCY: Preg Test, Ur: NEGATIVE

## 2014-09-24 LAB — GLUCOSE, POCT (MANUAL RESULT ENTRY): POC Glucose: 78 mg/dL (ref 70–99)

## 2014-09-24 MED ORDER — FLUOXETINE HCL 20 MG PO TABS: 20.0000 mg | ORAL_TABLET | Freq: Every day | ORAL | Status: DC

## 2014-09-24 NOTE — Progress Notes (Signed)
    MRN: [id number] DOB: 25-Nov-1970  Subjective:   Debbie Hebert is a 44 y.o. female presenting for chief complaint of Fatigue and Depression  Reports 1 week history of worsening fatigue and depression. Patient also has hypersomnia, sleeps during the day, feels guilt about sleeping and her weight, intermittent irritability, anhedonia. Patient also has bloating and constipation. Of note, she was recently taking phentermine but is not currently on this, feels like her depression may be due to coming off this medication. However, patient also admits that she has tried Zoloft many years ago for depression but came off of this due to weight gain. Patient is currently not working, has 2 teenage children. Denies any other aggravating or relieving factors, no other questions or concerns.  Debbie Hebert has a current medication list which includes the following prescription(s): metformin, acetaminophen, amoxicillin-clavulanate, cyclobenzaprine, hydrochlorothiazide, naproxen sodium, and pseudoephedrine-guaifenesin. She has No Known Allergies.  Debbie Hebert  has a past medical history of Hypertension and Dysmenorrhea. Also  has past surgical history that includes Cesarean section; Abdominal surgery; Oophorectomy; and Cosmetic surgery.  ROS As in subjective.  Objective:   Vitals: BP 128/82 mmHg  Pulse 64  Temp(Src) 98.4 F (36.9 C) (Oral)  Resp 16  Ht 5\' 2"  (1.575 m)  Wt 196 lb (88.905 kg)  BMI 35.84 kg/m2  SpO2 99%  LMP 08/24/2014  Physical Exam  Constitutional: She is oriented to person, place, and time. She appears well-developed and well-nourished.  Cardiovascular: Normal rate.   Pulmonary/Chest: Effort normal.  Neurological: She is alert and oriented to person, place, and time.  Psychiatric: Her mood appears not anxious. Her affect is not angry, not blunt, not labile and not inappropriate. Her speech is not rapid and/or pressured, not delayed, not tangential and not slurred. She is agitated. She  is not aggressive, not hyperactive, not slowed, not withdrawn, not actively hallucinating and not combative. She exhibits a depressed mood (with flat affect). She is communicative. She is attentive.   Assessment and Plan :   Patient stated that she would prefer to see a physician and therefore a physical exam was not completed. I did recommend patient have a thyroid level drawn and consider restarting antidepressant medication. Patient seemed agreeable but still preferred to see a physician. I precepted case over to Dr. Neva SeatGreene.  Wallis BambergMario Shoua Ulloa, PA-C [facility] [phone] 09/24/2014 8:40 PM

## 2014-09-24 NOTE — Patient Instructions (Addendum)
You should receive a call or letter about your lab results within the next week to 10 days.  Start prozac and recheck with myself or Wallis BambergMario Mani in next 2 weeks.  Return to the clinic or go to the nearest emergency room if any of your symptoms worsen or new symptoms occur. For counseling - here are some numbers: Di Kindlena Maria Buzzi: [phone] or Hilma FavorsMary Ann Garcia [phone]     Fatigue Fatigue is a feeling of tiredness, lack of energy, lack of motivation, or feeling tired all the time. Having enough rest, good nutrition, and reducing stress will normally reduce fatigue. Consult your caregiver if it persists. The nature of your fatigue will help your caregiver to find out its cause. The treatment is based on the cause.  CAUSES  There are many causes for fatigue. Most of the time, fatigue can be traced to one or more of your habits or routines. Most causes fit into one or more of three general areas. They are: Lifestyle problems  Sleep disturbances.  Overwork.  Physical exertion.  Unhealthy habits.  Poor eating habits or eating disorders.  Alcohol and/or drug use .  Lack of proper nutrition (malnutrition). Psychological problems  Stress and/or anxiety problems.  Depression.  Grief.  Boredom. Medical Problems or Conditions  Anemia.  Pregnancy.  Thyroid gland problems.  Recovery from major surgery.  Continuous pain.  Emphysema or asthma that is not well controlled  Allergic conditions.  Diabetes.  Infections (such as mononucleosis).  Obesity.  Sleep disorders, such as sleep apnea.  Heart failure or other heart-related problems.  Cancer.  Kidney disease.  Liver disease.  Effects of certain medicines such as antihistamines, cough and cold remedies, prescription pain medicines, heart and blood pressure medicines, drugs used for treatment of cancer, and some antidepressants. SYMPTOMS  The symptoms of fatigue include:   Lack of energy.  Lack of drive  (motivation).  Drowsiness.  Feeling of indifference to the surroundings. DIAGNOSIS  The details of how you feel help guide your caregiver in finding out what is causing the fatigue. You will be asked about your present and past health condition. It is important to review all medicines that you take, including prescription and non-prescription items. A thorough exam will be done. You will be questioned about your feelings, habits, and normal lifestyle. Your caregiver may suggest blood tests, urine tests, or other tests to look for common medical causes of fatigue.  TREATMENT  Fatigue is treated by correcting the underlying cause. For example, if you have continuous pain or depression, treating these causes will improve how you feel. Similarly, adjusting the dose of certain medicines will help in reducing fatigue.  HOME CARE INSTRUCTIONS   Try to get the required amount of good sleep every night.  Eat a healthy and nutritious diet, and drink enough water throughout the day.  Practice ways of relaxing (including yoga or meditation).  Exercise regularly.  Make plans to change situations that cause stress. Act on those plans so that stresses decrease over time. Keep your work and personal routine reasonable.  Avoid street drugs and minimize use of alcohol.  Start taking a daily multivitamin after consulting your caregiver. SEEK MEDICAL CARE IF:   You have persistent tiredness, which cannot be accounted for.  You have fever.  You have unintentional weight loss.  You have headaches.  You have disturbed sleep throughout the night.  You are feeling sad.  You have constipation.  You have dry skin.  You have gained  weight.  You are taking any new or different medicines that you suspect are causing fatigue.  You are unable to sleep at night.  You develop any unusual swelling of your legs or other parts of your body. SEEK IMMEDIATE MEDICAL CARE IF:   You are feeling  confused.  Your vision is blurred.  You feel faint or pass out.  You develop severe headache.  You develop severe abdominal, pelvic, or back pain.  You develop chest pain, shortness of breath, or an irregular or fast heartbeat.  You are unable to pass a normal amount of urine.  You develop abnormal bleeding such as bleeding from the rectum or you vomit blood.  You have thoughts about harming yourself or committing suicide.  You are worried that you might harm someone else. MAKE SURE YOU:   Understand these instructions.  Will watch your condition.  Will get help right away if you are not doing well or get worse. Document Released: 02/20/2007 Document Revised: 07/18/2011 Document Reviewed: 08/27/2013 [facility] Patient Information 2015 [place], [place]. This information is not intended to replace advice given to you by your health care provider. Make sure you discuss any questions you have with your health care provider.

## 2014-09-24 NOTE — Progress Notes (Addendum)
Subjective:    Patient ID: Debbie Hebert, female    DOB: Sep 06, 1970, 44 y.o.   MRN: [id number] This chart was scribed for Meredith Staggers, MD by Leona Carry, ED Scribe. The patient was seen in 12. The patient's care was started at 8:39 PM.   HPI Debbie Hebert is a 44 y.o. female initially seen by Wallis Bamberg. See his note for details on history and symptoms. She also requested a second opinion from a physician.   Patient complains of generalized fatigue and depression beginning one week ago. She also reports decreased motivation. She has a history of depression beginning 10 years ago. She reports that she has experienced several ups and downs in her feelings of depression in the past 10 years. She reports that she has taken Zoloft and Cymbalta with some relief of symptoms. She has not taken these recently. She experienced a weight gain when taking Zoloft and Cymbalta in the past and does not want to take these again.  She has taken phentermine for several years for weight loss and has been off of it for approximately one week. She reports some fluid retention in her legs for the past week.  This has occurred in the past.   Patient also complains of occasional episodes of vomiting after exercise beginning two weeks ago. She denies suicidal or homicidal ideations.   She also reports experiencing twice in the past year but has not experienced it recently.  She states that she has a BM each day. She reports that she is experiencing some constipation.  LNMP 08/25/2014. She is not using any birth control.  Patient Active Problem List   Diagnosis Date Noted  . HTN (hypertension) 04/12/2012  . Weight gain 04/12/2012  . DUB (dysfunctional uterine bleeding) 04/12/2012  . Pelvic pain in female 04/12/2012   Past Medical History  Diagnosis Date  . Hypertension   . Dysmenorrhea    Past Surgical History  Procedure Laterality Date  . Cesarean section      X2  . Abdominal surgery        LIPOSUCTION  . Oophorectomy      RIGHT, WAS DONE IN [place]  . Cosmetic surgery     No Known Allergies Prior to Admission medications   Medication Sig Start Date End Date Taking? Authorizing Provider  metFORMIN (GLUCOPHAGE) 500 MG tablet Take 500 mg by mouth 2 (two) times daily with a meal.   Yes Historical Provider, MD  acetaminophen (TYLENOL) 325 MG tablet Take 650 mg by mouth every 6 (six) hours as needed.    Historical Provider, MD  amoxicillin-clavulanate (AUGMENTIN) 875-125 MG per tablet Take 1 tablet by mouth 2 (two) times daily. Patient not taking: Reported on 09/24/2014 07/13/14   Carmelina Dane, MD  cyclobenzaprine (FLEXERIL) 10 MG tablet Take 1 tablet (10 mg total) by mouth 3 (three) times daily as needed for muscle spasms. Patient not taking: Reported on 09/24/2014 07/13/14   Carmelina Dane, MD  hydrochlorothiazide (MICROZIDE) 12.5 MG capsule Take 12.5 mg by mouth daily.    Historical Provider, MD  naproxen sodium (ANAPROX DS) 550 MG tablet Take 1 tablet (550 mg total) by mouth 2 (two) times daily with a meal. Patient not taking: Reported on 09/24/2014 07/13/14 07/13/15  Carmelina Dane, MD  pseudoephedrine-guaifenesin [facility] D) 60-600 MG per tablet Take 1 tablet by mouth every 12 (twelve) hours. Patient not taking: Reported on 09/24/2014 07/13/14 07/13/15  Carmelina Dane, MD   History  Social History  . Marital Status: Married    Spouse Name: N/A  . Number of Children: N/A  . Years of Education: N/A   Occupational History  . Not on file.   Social History Main Topics  . Smoking status: Never Smoker   . Smokeless tobacco: Never Used  . Alcohol Use: No  . Drug Use: No  . Sexual Activity: Yes    Birth Control/ Protection: Condom   Other Topics Concern  . Not on file   Social History Narrative      Review of Systems  Constitutional: Positive for fatigue.  Gastrointestinal: Positive for vomiting and constipation.  Psychiatric/Behavioral: Positive for  dysphoric mood. Negative for suicidal ideas and self-injury.       Objective:   Physical Exam  Constitutional: She is oriented to person, place, and time. She appears well-developed and well-nourished. No distress.  HENT:  Head: Normocephalic and atraumatic.  Eyes: Conjunctivae and EOM are normal. Pupils are equal, round, and reactive to light.  Neck: Neck supple. Carotid bruit is not present. No tracheal deviation present. No thyromegaly present.  Cardiovascular: Normal rate, regular rhythm, normal heart sounds and intact distal pulses.   Pulmonary/Chest: Effort normal and breath sounds normal. No respiratory distress.  Abdominal: Soft. She exhibits no pulsatile midline mass. There is tenderness.  Minimal tenderness over the superpubic area. Minimal LLQ tenderness.  Musculoskeletal: Normal range of motion.  Neurological: She is alert and oriented to person, place, and time.  Skin: Skin is warm and dry.  Psychiatric: She has a normal mood and affect. Her behavior is normal.  Nursing note and vitals reviewed.  Filed Vitals:   09/24/14 1932  BP: 128/82  Pulse: 64  Temp: 98.4 F (36.9 C)  TempSrc: Oral  Resp: 16  Height:  (1.575 m)  Weight: 196 lb (88.905 kg)  SpO2: 99%   Results for orders placed or performed in visit on 09/24/14  POCT CBC  Result Value Ref Range   WBC 8.7 4.6 - 10.2 K/uL   Lymph, poc 3.6 (A) 0.6 - 3.4   POC LYMPH PERCENT 41.8 10 - 50 %L   MID (cbc) 0.7 0 - 0.9   POC MID % 7.8 0 - 12 %M   POC Granulocyte 4.4 2 - 6.9   Granulocyte percent 50.4 37 - 80 %G   RBC 4.23 4.04 - 5.48 M/uL   Hemoglobin 13.2 12.2 - 16.2 g/dL   HCT, POC 69.6 29.5 - 47.9 %   MCV 93.1 80 - 97 fL   MCH, POC 31.3 (A) 27 - 31.2 pg   MCHC 33.6 31.8 - 35.4 g/dL   RDW, POC 28.4 %   Platelet Count, POC 242 142 - 424 K/uL   MPV 8.5 0 - 99.8 fL  POCT glucose (manual entry)  Result Value Ref Range   POC Glucose 78 70 - 99 mg/dl  POCT urine pregnancy  Result Value Ref Range   Preg  Test, Ur Negative   POCT urinalysis dipstick  Result Value Ref Range   Color, UA yellow    Clarity, UA clear    Glucose, UA neg    Bilirubin, UA neg    Ketones, UA neg    Spec Grav, UA 1.025    Blood, UA neg    pH, UA 7.0    Protein, UA neg    Urobilinogen, UA 0.2    Nitrite, UA neg    Leukocytes, UA Negative   POCT UA - Microscopic  Only  Result Value Ref Range   WBC, Ur, HPF, POC 1-5    RBC, urine, microscopic rare    Bacteria, U Microscopic 1+    Mucus, UA neg    Epithelial cells, urine per micros 1-5    Crystals, Ur, HPF, POC neg    Casts, Ur, LPF, POC neg    Yeast, UA neg        Assessment & Plan:   Melvern BankerBlanca E Rodier is a 44 y.o. female Other fatigue - Plan: TSH, POCT CBC, POCT glucose (manual entry), Comprehensive metabolic panel  - possible depression source, other labs pending as above.   Constipation, unspecified constipation type - Plan: TSH  -fiber and drink sufficient fluids, tsh pending.   Major depressive disorder, recurrent episode, mild - Plan: FLUoxetine (PROZAC) 20 MG tablet  -restart SSRI, chose Prozac to lessen chance of weight gain. SED and recheck in few weeks.   Abdominal pain, Abdominal bloating, vomiting, generalized/Suprapubic abdominal pain, unspecified laterality - Pl- Plan: POCT urine pregnancy, POCT urinalysis dipstick  -constipation possible source. Reassuring U/A and CBC in office - CMP pending. Rtc/er precautions if worsens.     Meds ordered this encounter  Medications  . FLUoxetine (PROZAC) 20 MG tablet    Sig: Take 1 tablet (20 mg total) by mouth daily.    Dispense:  30 tablet    Refill:  1   Patient Instructions  You should receive a call or letter about your lab results within the next week to 10 days.  Start prozac and recheck with myself or Wallis BambergMario Mani in next 2 weeks.  Return to the clinic or go to the nearest emergency room if any of your symptoms worsen or new symptoms occur. For counseling - here are some numbers: Di Kindlena  Maria Buzzi: [phone] or Hilma FavorsMary Ann Garcia [phone]     Fatigue Fatigue is a feeling of tiredness, lack of energy, lack of motivation, or feeling tired all the time. Having enough rest, good nutrition, and reducing stress will normally reduce fatigue. Consult your caregiver if it persists. The nature of your fatigue will help your caregiver to find out its cause. The treatment is based on the cause.  CAUSES  There are many causes for fatigue. Most of the time, fatigue can be traced to one or more of your habits or routines. Most causes fit into one or more of three general areas. They are: Lifestyle problems  Sleep disturbances.  Overwork.  Physical exertion.  Unhealthy habits.  Poor eating habits or eating disorders.  Alcohol and/or drug use .  Lack of proper nutrition (malnutrition). Psychological problems  Stress and/or anxiety problems.  Depression.  Grief.  Boredom. Medical Problems or Conditions  Anemia.  Pregnancy.  Thyroid gland problems.  Recovery from major surgery.  Continuous pain.  Emphysema or asthma that is not well controlled  Allergic conditions.  Diabetes.  Infections (such as mononucleosis).  Obesity.  Sleep disorders, such as sleep apnea.  Heart failure or other heart-related problems.  Cancer.  Kidney disease.  Liver disease.  Effects of certain medicines such as antihistamines, cough and cold remedies, prescription pain medicines, heart and blood pressure medicines, drugs used for treatment of cancer, and some antidepressants. SYMPTOMS  The symptoms of fatigue include:   Lack of energy.  Lack of drive (motivation).  Drowsiness.  Feeling of indifference to the surroundings. DIAGNOSIS  The details of how you feel help guide your caregiver in finding out what is causing the fatigue. You will  be asked about your present and past health condition. It is important to review all medicines that you take, including prescription  and non-prescription items. A thorough exam will be done. You will be questioned about your feelings, habits, and normal lifestyle. Your caregiver may suggest blood tests, urine tests, or other tests to look for common medical causes of fatigue.  TREATMENT  Fatigue is treated by correcting the underlying cause. For example, if you have continuous pain or depression, treating these causes will improve how you feel. Similarly, adjusting the dose of certain medicines will help in reducing fatigue.  HOME CARE INSTRUCTIONS   Try to get the required amount of good sleep every night.  Eat a healthy and nutritious diet, and drink enough water throughout the day.  Practice ways of relaxing (including yoga or meditation).  Exercise regularly.  Make plans to change situations that cause stress. Act on those plans so that stresses decrease over time. Keep your work and personal routine reasonable.  Avoid street drugs and minimize use of alcohol.  Start taking a daily multivitamin after consulting your caregiver. SEEK MEDICAL CARE IF:   You have persistent tiredness, which cannot be accounted for.  You have fever.  You have unintentional weight loss.  You have headaches.  You have disturbed sleep throughout the night.  You are feeling sad.  You have constipation.  You have dry skin.  You have gained weight.  You are taking any new or different medicines that you suspect are causing fatigue.  You are unable to sleep at night.  You develop any unusual swelling of your legs or other parts of your body. SEEK IMMEDIATE MEDICAL CARE IF:   You are feeling confused.  Your vision is blurred.  You feel faint or pass out.  You develop severe headache.  You develop severe abdominal, pelvic, or back pain.  You develop chest pain, shortness of breath, or an irregular or fast heartbeat.  You are unable to pass a normal amount of urine.  You develop abnormal bleeding such as bleeding  from the rectum or you vomit blood.  You have thoughts about harming yourself or committing suicide.  You are worried that you might harm someone else. MAKE SURE YOU:   Understand these instructions.  Will watch your condition.  Will get help right away if you are not doing well or get worse. Document Released: 02/20/2007 Document Revised: 07/18/2011 Document Reviewed: 08/27/2013 [facility] Patient Information 2015 [employer], [place]. This information is not intended to replace advice given to you by your health care provider. Make sure you discuss any questions you have with your health care provider.    I personally performed the services described in this documentation, which was scribed in my presence. The recorded information has been reviewed and considered, and addended by me as needed.

## 2014-09-25 LAB — COMPREHENSIVE METABOLIC PANEL
ALT: 67 U/L — ABNORMAL HIGH (ref 0–35)
AST: 36 U/L (ref 0–37)
Albumin: 4 g/dL (ref 3.5–5.2)
Alkaline Phosphatase: 51 U/L (ref 39–117)
BUN: 11 mg/dL (ref 6–23)
CO2: 28 meq/L (ref 19–32)
Calcium: 8.9 mg/dL (ref 8.4–10.5)
Chloride: 103 meq/L (ref 96–112)
Creat: 0.85 mg/dL (ref 0.50–1.10)
Glucose, Bld: 89 mg/dL (ref 70–99)
Potassium: 4 meq/L (ref 3.5–5.3)
Sodium: 138 meq/L (ref 135–145)
Total Bilirubin: 0.3 mg/dL (ref 0.2–1.2)
Total Protein: 6.9 g/dL (ref 6.0–8.3)

## 2014-09-25 LAB — TSH: TSH: 3.592 u[IU]/mL (ref 0.350–4.500)

## 2014-10-17 ENCOUNTER — Encounter: Payer: Self-pay | Admitting: Family Medicine

## 2014-12-12 ENCOUNTER — Ambulatory Visit (INDEPENDENT_AMBULATORY_CARE_PROVIDER_SITE_OTHER): Payer: Self-pay | Admitting: Family Medicine

## 2014-12-12 VITALS — BP 108/74 | HR 79 | Temp 98.1°F | Resp 18 | Ht 62.0 in | Wt 197.0 lb

## 2014-12-12 DIAGNOSIS — R7401 Elevation of levels of liver transaminase levels: Secondary | ICD-10-CM

## 2014-12-12 DIAGNOSIS — R740 Nonspecific elevation of levels of transaminase and lactic acid dehydrogenase [LDH]: Secondary | ICD-10-CM

## 2014-12-12 DIAGNOSIS — E669 Obesity, unspecified: Secondary | ICD-10-CM | POA: Insufficient documentation

## 2014-12-12 DIAGNOSIS — F330 Major depressive disorder, recurrent, mild: Secondary | ICD-10-CM

## 2014-12-12 MED ORDER — FLUOXETINE HCL 20 MG PO TABS: 20.0000 mg | ORAL_TABLET | Freq: Every day | ORAL | Status: DC

## 2014-12-12 NOTE — Patient Instructions (Signed)
I will be in touch regarding your liver function tests.  If your tests are still high we will send you for an ultrasound.  However I think that your liver is fine!  Losing weight is very hard for most people- you are not alone!   It does not seem that the phentermine is helping you so please stop taking it I would recommend that you sign up for weight watchers.  Remember that diet counts for more than exercise- don't exercise so much that you get worn out and too hungry!    Try to lose about 1 pound a week- if you lose weight slowly you are more likely to keep it off  Start back on prozac 20 mg- take one a day for 2 weeks, then increase to two a day  Please come and see me in about 2 months to check on your progress

## 2014-12-12 NOTE — Progress Notes (Addendum)
[facility] [address] [place] [address] [phone]  Date:  12/12/2014   Name:  Debbie Hebert   DOB:  May 26, 1970   MRN:  [id number]  PCP:  [name], MD    Chief Complaint: Follow-up; Weight Gain; and Depression   History of Present Illness:  Debbie Hebert is a 44 y.o. very pleasant female patient who presents with the following:  Here today to follow-up recent labs and depression. She had normal labs in May of this year- TSH ok, CMP ok except ALT a little bit high We started her back on prozac in May for her depression- however she is no longer taking it. She states that she stopped taking the prozac because another clinic in [place] started her on bupropion and phentermine for weight loss.  She is no no longer taking these meds either  She has not gone back to the provider who was treating her with the phentermine.   She is very frustrated that she is not able to lose weight.   She states that she is "running every day" and doing exercise for 2 hours every day.    She was worried that she received a letter asking her to come in and have her abnl liver function tests looked at- she would like to do this today.  When reassured that her mildly elevated LFTs are likely not anything terrible she felt a lot better She does notice feelings of depression, but does not have any SI  Wt Readings from Last 3 Encounters:  12/12/14 197 lb (89.359 kg)  09/24/14 196 lb (88.905 kg)  07/13/14 192 lb 8 oz (87.317 kg)      Patient Active Problem List   Diagnosis Date Noted  . HTN (hypertension) 04/12/2012  . Weight gain 04/12/2012  . DUB (dysfunctional uterine bleeding) 04/12/2012  . Pelvic pain in female 04/12/2012    Past Medical History  Diagnosis Date  . Hypertension   . Dysmenorrhea     Past Surgical History  Procedure Laterality Date  . Cesarean section      X2  . Abdominal surgery      LIPOSUCTION  . Oophorectomy     RIGHT, WAS DONE IN [place]  . Cosmetic surgery      History  Substance Use Topics  . Smoking status: Never Smoker   . Smokeless tobacco: Never Used  . Alcohol Use: No    Family History  Problem Relation Age of Onset  . Diabetes Mother   . Hypertension Sister   . Hypertension Paternal Grandmother     No Known Allergies  Medication list has been reviewed and updated.  Current Outpatient Prescriptions on File Prior to Visit  Medication Sig Dispense Refill  . acetaminophen (TYLENOL) 325 MG tablet Take 650 mg by mouth every 6 (six) hours as needed.    Marland Kitchen FLUoxetine (PROZAC) 20 MG tablet Take 1 tablet (20 mg total) by mouth daily. (Patient not taking: Reported on 12/12/2014) 30 tablet 1  . hydrochlorothiazide (MICROZIDE) 12.5 MG capsule Take 12.5 mg by mouth daily.    . metFORMIN (GLUCOPHAGE) 500 MG tablet Take 500 mg by mouth 2 (two) times daily with a meal.     No current facility-administered medications on file prior to visit.    Review of Systems:  As per HPI- otherwise negative.   Physical Examination: Filed Vitals:   12/12/14 1821  BP: 108/74  Pulse: 79  Temp: 98.1 F (36.7 C)  Resp: 18   Filed Vitals:   12/12/14 1821  Height:  (1.575 m)  Weight: 197 lb (89.359 kg)   Body mass index is 36.02 kg/(m^2). Ideal Body Weight: Weight in (lb) to have BMI = 25: 136.4  GEN: WDWN, NAD, Non-toxic, A & O x 3, obese, looks well HEENT: Atraumatic, Normocephalic. Neck supple. No masses, No LAD. Ears and Nose: No external deformity. CV: RRR, No M/G/R. No JVD. No thrill. No extra heart sounds. PULM: CTA B, no wheezes, crackles, rhonchi. No retractions. No resp. distress. No accessory muscle use. ABD: S, NT, ND EXTR: No c/c/e NEURO Normal gait.  PSYCH: Normally interactive. Conversant. Not depressed or anxious appearing.  Calm demeanor.    Assessment and Plan: Transaminitis - Plan: Hepatic function panel  Major depressive disorder, recurrent episode, mild - Plan:  FLUoxetine (PROZAC) 20 MG tablet  Will recheck LFTs today- if still elevated plan to do an [place] Start back on prozac 20 mg for depression- counseled her that weight loss is hard, it is a slow process and she should take take to lose weight slowly. Encouraged her to join weight watchers Recheck in 2 months   Signed Abbe Amsterdam, MD  Called on 8/7 and got her husband- let him know that her LFTs are still up.  Will arrange for an [place].  He understands and will tell her.  Also asked lab to add a hep panel

## 2014-12-13 LAB — HEPATIC FUNCTION PANEL
ALT: 87 U/L — ABNORMAL HIGH (ref 6–29)
AST: 42 U/L — ABNORMAL HIGH (ref 10–30)
Albumin: 4.1 g/dL (ref 3.6–5.1)
Alkaline Phosphatase: 57 U/L (ref 33–115)
Bilirubin, Direct: 0.1 mg/dL (ref <=0.2)
Indirect Bilirubin: 0.3 mg/dL (ref 0.2–1.2)
Total Bilirubin: 0.4 mg/dL (ref 0.2–1.2)
Total Protein: 6.9 g/dL (ref 6.1–8.1)

## 2014-12-14 NOTE — Addendum Note (Signed)
Addended by: Abbe Amsterdam C on: 12/14/2014 07:12 PM   Modules accepted: Orders

## 2014-12-19 ENCOUNTER — Ambulatory Visit
Admission: RE | Admit: 2014-12-19 | Discharge: 2014-12-19 | Disposition: A | Discharge status: Home / Self Care | Payer: No Typology Code available for payment source | Source: Ambulatory Visit | Attending: Family Medicine | Admitting: Family Medicine

## 2014-12-19 DIAGNOSIS — R7401 Elevation of levels of liver transaminase levels: Secondary | ICD-10-CM

## 2014-12-19 DIAGNOSIS — R740 Nonspecific elevation of levels of transaminase and lactic acid dehydrogenase [LDH]: Principal | ICD-10-CM

## 2014-12-23 ENCOUNTER — Encounter: Payer: Self-pay | Admitting: Family Medicine

## 2014-12-23 ENCOUNTER — Telehealth: Payer: Self-pay | Admitting: Family Medicine

## 2014-12-23 NOTE — Telephone Encounter (Signed)
Received her recent LFTs and RUQ [place]  Results for orders placed or performed in visit on 12/12/14  Hepatic function panel  Result Value Ref Range   Total Bilirubin 0.4 0.2 - 1.2 mg/dL   Bilirubin, Direct 0.1 <=0.2 mg/dL   Indirect Bilirubin 0.3 0.2 - 1.2 mg/dL   Alkaline Phosphatase 57 33 - 115 U/L   AST 42 (H) 10 - 30 U/L   ALT 87 (H) 6 - 29 U/L   Total Protein 6.9 6.1 - 8.1 g/dL   Albumin 4.1 3.6 - 5.1 g/dL   US ABDOMEN LIMITED - RIGHT UPPER QUADRANT  COMPARISON: None in PACs  FINDINGS: Gallbladder:  No gallstones or wall thickening visualized. No sonographic Murphy sign noted. Common bile duct: Diameter: 4.5 mm  Liver: The hepatic echotexture is mildly increased. There is no focal mass or ductal dilation.  IMPRESSION: Probable fatty infiltrative change of the liver. No acute abnormality of the liver, gallbladder, or common bile duct.  Spoke with her husband- her [place] is overall reassuring, but weigh loss is important for her liver health. She will work on this and come and see me in 2 months for a recheck

## 2015-03-26 ENCOUNTER — Encounter: Payer: Self-pay | Admitting: Gynecology

## 2015-04-21 ENCOUNTER — Ambulatory Visit (INDEPENDENT_AMBULATORY_CARE_PROVIDER_SITE_OTHER): Payer: Self-pay | Admitting: Emergency Medicine

## 2015-04-21 VITALS — BP 118/70 | HR 77 | Temp 98.1°F | Resp 16 | Ht 62.0 in | Wt 200.0 lb

## 2015-04-21 DIAGNOSIS — R110 Nausea: Secondary | ICD-10-CM

## 2015-04-21 DIAGNOSIS — T887XXA Unspecified adverse effect of drug or medicament, initial encounter: Secondary | ICD-10-CM

## 2015-04-21 DIAGNOSIS — T50905A Adverse effect of unspecified drugs, medicaments and biological substances, initial encounter: Secondary | ICD-10-CM

## 2015-04-21 NOTE — Patient Instructions (Signed)
Recuento de caloras para bajar de peso (Calorie Counting for Edison InternationalWeight Loss) Las caloras son energa que se obtiene de lo que se come y se bebe. El organismo Botswanausa esta energa para mantenerlo Scientist, water qualityactivo durante el da. La cantidad de caloras que come tiene incidencia Gap Incen el peso. Cuando come ms caloras de las que el cuerpo necesita, este acumula las caloras extra Woodstoncomo grasa. Cuando come Nationwide Mutual Insurancemenos caloras de las que el cuerpo Otisvillenecesita, este quema grasa para obtener la energa que requiere. El recuento de caloras es el registro de la cantidad de caloras que come y Investment banker, operationalbebe cada da. Si se asegura de comer menos caloras de las que el cuerpo necesita, debe bajar de Floresvillepeso. Para que el recuento de caloras funcione, tendr que comer la cantidad de caloras adecuadas para usted en un da, para bajar una cantidad de peso saludable por semana. Una cantidad de peso saludable para bajar por semana suele ser Park Layneentre 1 y Enedina Finner2libras (0,5 a 0,9kg). Un nutricionista puede determinar la cantidad de caloras que necesita por da y sugerirle cmo alcanzar su objetivo calrico.  CUL ES MI PLAN? Mi objetivo es comer __________ Garry Heatercaloras por da.  Si como esta cantidad de caloras por da, debo bajar unas __________ Albertine Grateslibras por semana. QU DEBO SABER ACERCA DEL RECUENTO DE CALORAS? A fin de alcanzar su objetivo diario de caloras, tendr que:  Averiguar cuntas caloras hay en cada alimento que le Lobbyistgustara comer. Intente hacerlo antes de comer.  Decida la cantidad que puede comer del alimento.  Anote lo que comi y cuntas caloras tena. Esta tarea se conoce como llevar un registro de comidas. DNDE ENCUENTRO INFORMACIN SOBRE LAS CALORAS? Es posible Veterinary surgeonencontrar la cantidad de caloras que contiene un alimento en la etiqueta de informacin nutricional. Tenga en cuenta que toda la informacin que se incluye en una etiqueta se basa en una porcin especfica del alimento. Si un alimento no tiene una etiqueta de informacin  nutricional, intente buscar las caloras en Internet o pida ayuda al nutricionista. CMO DECIDO CUNTO COMER? Para decidir qu cantidad del alimento puede comer, tendr que tener en cuenta el nmero de caloras en una porcin y el tamao de una porcin. Es posible Scientist, water qualityencontrar estos datos en la etiqueta de informacin nutricional. Si un alimento no tiene una etiqueta de informacin nutricional, busque los Maxatawnydatos en Internet o pida ayuda al nutricionista. Recuerde que las caloras se calculan por porcin. Si opta por comer ms de una porcin de un alimento, tendr D.R. Horton, Incque multiplicar las caloras por porcin por la cantidad de porciones que planea comer. Por ejemplo, la etiqueta de un envase de pan puede decir que el tamao de una porcin es [place]1rodaja, y que una porcin tiene 90caloras. Si come 1rodaja, habr comido 90caloras. Si come 2rodajas, habr comido 180caloras. CMO LLEVO UN REGISTRO DE COMIDAS? Despus de cada comida, registre la siguiente informacin en el registro de comidas:  Lo que comi.  La cantidad que comi.  La cantidad de caloras que tena.  Luego, sume las caloras. Tenga a Scientific laboratory technicianmano el registro de comidas, por ejemplo, en un anotador de bolsillo. Otra alternativa es usar una aplicacin en el telfono mvil o un sitio web. Algunos programas calcularn las caloras y AES Corporationle mostrarn la cantidad de caloras que le [place] Pass[place]ed[place], Delawarecada vez que agregue un alimento al Engineer, maintenance (IT)registro. CULES SON ALGUNOS CONSEJOS PARA EL RECUENTO DE CALORAS?  Use las caloras de los alimentos y las bebidas que lo sacien y no lo dejen con apetito, por ejemplo, frutos secos  y Civil engineer, contractingmantequillas de frutos secos, verduras, protenas magras y alimentos con alto contenido de fibra (ms de 5g de fibra por porcin).  Coma alimentos nutritivos y evite las caloras vacas. Las caloras vacas son aquellas que se obtienen de los alimentos o las bebidas que no contienen muchos nutrientes, como los dulces y los refrescos. Es mejor comer  una comida nutritiva altamente calrica (como un aguacate) que una con pocos nutrientes (como una bolsa de patatas fritas).  Sepa cuntas caloras tienen los alimentos que come con ms frecuencia. Liberty GlobalDe este modo, no tiene que buscar este dato cada vez que los come.  Est atento a los International aid/development workeralimentos que pueden parecer hipocalricos, pero que en realidad contienen muchas caloras, como los productos de Cobdenpanadera, los refrescos y los dulces sin Holiday representativegrasa.  Preste atencin a las Limited Brandscaloras en las bebidas, Lubrizol Corporationcomo los refrescos, las bebidas a base de McBeecaf, las bebidas con alcohol y los jugos, que contienen muchas caloras, pero no le dan saciedad. Opte por las bebidas bajas en caloras, como el agua y las [address] Streetbebidas dietticas.  Concntrese en tratar de contar las caloras de los alimentos que tienen la mayor cantidad de caloras. Registrar las caloras de una ensalada que solo contiene hortalizas es menos importante que calcular las de un batido de Tiog[place]eche.  Encuentre un mtodo para controlar las caloras que funcione para usted. Sea creativo. La Harley-Davidsonmayora de las personas que alcanzan el xito encuentran mtodos para llevar un registro de cunto comen en un da, incluso si no cuentan cada calora. CULES SON ALGUNOS CONSEJOS PARA CONTROLAR LAS PORCIONES?  Sepa cuntas caloras hay en una porcin. Esto lo ayudar a saber cuntas porciones de un alimento determinado puede comer.  Use una taza medidora para medir los Franklin Resourcestamaos de las porciones, lo que es muy til al principio. Con el tiempo, podr hacer un clculo estimativo de los tamaos de las porciones de algunos alimentos.  Dedique tiempo a poner porciones de diferentes alimentos en sus platos, tazones y tazas predilectos, a fin de saber cmo se ve una porcin.  Intente no comer directamente de [place] Fasouna bolsa o una caja, ya que esto puede llevarlo a comer en exceso. Ponga la cantidad Wal-Martque le gustara comer en una taza o un plato, a fin de asegurarse de que est comiendo la  porcin correcta.  Use platos, vasos y tazones ms pequeos para no comer en exceso. Esta es una forma rpida y sencilla de poner en prctica el control de las porciones. Si el plato es ms pequeo, le caben menos alimentos.  Intente no hacer muchas tareas mientras come, como ver televisin o usar la computadora. Si es la hora de comer, sintese a Museum/gallery conservatorla mesa y disfrute de Chemical engineerla comida. Esto lo ayudar a que empiece a Public house managerreconocer cundo est satisfecho. Tambin le permitir estar ms consciente de lo que come y cunto est comiendo. CMO PUEDO HACER EL RECUENTO DE CALORAS CUANDO COMO AFUERA?  Pida porciones ms pequeas o porciones para nios.  Considere la posibilidad de Agricultural consultantcompartir un plato principal y las guarniciones, en lugar de pedir su propio plato principal.  Si pide su propio plato principal, coma solo la mitad. Pida una caja al comienzo de la comida y ponga all el resto del plato principal, para no sentir la tentacin de comerlo.  Busque las caloras en el men. Si se detallan las caloras, elija las opciones que contengan la menor cantidad.  Elija platos que incluyan verduras, frutas, cereales integrales, productos lcteos con bajo contenido de [place]the [place] deg [place]grasa  y protenas magras. Centrarse en elegir con inteligencia alimentos de cada uno de los 5grupos que puede ayudarlo a seguir por el buen camino en los restaurantes.  Opte por los alimentos hervidos, asados, cocidos a la parrilla o al vapor.  Elija el agua, la [place], [place] t helado sin azcar u otras bebidas que no contengan azcares agregados. Si desea una bebida alcohlica, escoja una opcin con menos caloras. Por ejemplo, un margarita normal puede [employer], y un vaso de vino tiene unas 150.  No coma alimentos que contengan mantequilla, estn empanados, fritos o que se sirvan con salsa a base de crema. Generalmente, los alimentos que se etiquetan como "crujientes" estn fritos, a menos que se indique lo contrario.  Ordene los  Pathmark Stores, las salsas y los jarabes aparte, ya que suelen tener muchas caloras; por lo tanto, no los consuma en grandes cantidades.  Tenga cuidado con las Alva. Muchas personas piensan que las ensaladas son Neomia Dear opcin saludable, pero en muchas cosas, esto no es as. Hay muchas ensaladas que contienen tocino, pollo frito, grandes cantidades de [place], patatas fritas y Print production planner. Todos estos productos son altamente calricos. Si desea Canary Brim, elija una de hortalizas y pida carnes a la parrilla o un filete. Ordene el aderezo aparte o pida aceite de Alpha y vinagre o limn para Emergency planning/management officer.  Haga un clculo estimativo de la cantidad de porciones que le sirven. Por ejemplo, una porcin de arroz cocido equivale a media taza o tiene el tamao aproximado de un molde de [place], o de media pelota de tenis. Conocer el tamao de las porciones lo ayudar a Theme park manager atento a la cantidad de comida que come Pitney Bowes. La lista que sigue le [place] el tamao de algunas porciones comunes a partir de objetos cotidianos.  1onza (28g) = 4dados apilados.  3onzas (85g) = de cartas.  1cucharadita = 1dado.  1cucharada = media pelota de tenis de mesa.  2cucharadas = 1pelota de tenis de mesa.  Media taza = 1pelota de tenis o de magdalena.  1taza = 1 pelota de bisbol.   Esta informacin no tiene Theme park manager el consejo del mdico. Asegrese de hacerle al mdico cualquier pregunta que tenga.   Document Released: 08/11/2008 Document Revised: 05/16/2014 Elsevier Interactive Patient Education [employer]

## 2015-04-21 NOTE — Progress Notes (Signed)
Subjective:  Patient ID: Debbie Hebert, female    DOB: 1970-10-17  Age: 44 y.o. MRN: [id number]  CC: Nausea; Chills; and Abdominal Pain   HPI Debbie BankerBlanca E Hebert presents  patients been using the weight loss formulation of Victoza for a month. She is up to 3 mg and over the last week has had nausea. She's not feeling well. She has little energy. She is eating her normal amount of food despite her medication and his dismay that she spent $1200 on the medication for a years supply with no improvement in her weight loss  History Debbie Hebert has a past medical history of Hypertension and Dysmenorrhea.   She has past surgical history that includes Cesarean section; Abdominal surgery; Oophorectomy; and Cosmetic surgery.   Her  family history includes Diabetes in her mother; Hypertension in her paternal grandmother and sister.  She   reports that she has never smoked. She has never used smokeless tobacco. She reports that she does not drink alcohol or use illicit drugs.  Outpatient Prescriptions Prior to Visit  Medication Sig Dispense Refill  . hydrochlorothiazide (MICROZIDE) 12.5 MG capsule Take 12.5 mg by mouth daily.    . metFORMIN (GLUCOPHAGE) 500 MG tablet Take 500 mg by mouth 2 (two) times daily with a meal.    . acetaminophen (TYLENOL) 325 MG tablet Take 650 mg by mouth every 6 (six) hours as needed.    Marland Kitchen. FLUoxetine (PROZAC) 20 MG tablet Take 1 tablet (20 mg total) by mouth daily. Repeat to 2 tablets after 2 weeks (Patient not taking: Reported on 04/21/2015) 60 tablet 3   No facility-administered medications prior to visit.    Social History   Social History  . Marital Status: Married    Spouse Name: N/A  . Number of Children: N/A  . Years of Education: N/A   Social History Main Topics  . Smoking status: Never Smoker   . Smokeless tobacco: Never Used  . Alcohol Use: No  . Drug Use: No  . Sexual Activity: Yes    Birth Control/ Protection: Condom   Other Topics Concern    . None   Social History Narrative     Review of Systems  Constitutional: Positive for fatigue. Negative for fever, chills and appetite change.  HENT: Negative for congestion, ear pain, postnasal drip, sinus pressure and sore throat.   Eyes: Negative for pain and redness.  Respiratory: Negative for cough, shortness of breath and wheezing.   Cardiovascular: Negative for leg swelling.  Gastrointestinal: Positive for nausea. Negative for vomiting, abdominal pain, diarrhea, constipation and blood in stool.  Endocrine: Negative for polyuria.  Genitourinary: Negative for dysuria, urgency, frequency and flank pain.  Musculoskeletal: Negative for gait problem.  Skin: Negative for rash.  Neurological: Negative for weakness and headaches.  Psychiatric/Behavioral: Negative for confusion and decreased concentration. The patient is not nervous/anxious.     Objective:  BP 118/70 mmHg  Pulse 77  Temp(Src) 98.1 F (36.7 C) (Oral)  Resp 16  Ht 5\' 2"  (1.575 m)  Wt 200 lb (90.719 kg)  BMI 36.57 kg/m2  SpO2 98%  Physical Exam  Constitutional: She is oriented to person, place, and time. She appears well-developed and well-nourished. No distress.  HENT:  Head: Normocephalic and atraumatic.  Right Ear: External ear normal.  Left Ear: External ear normal.  Nose: Nose normal.  Eyes: Conjunctivae and EOM are normal. Pupils are equal, round, and reactive to light. No scleral icterus.  Neck: Normal range of motion. Neck  supple. No tracheal deviation present.  Cardiovascular: Normal rate, regular rhythm and normal heart sounds.   Pulmonary/Chest: Effort normal. No respiratory distress. She has no wheezes. She has no rales.  Abdominal: She exhibits no mass. There is no tenderness. There is no rebound and no guarding.  Musculoskeletal: She exhibits no edema.  Lymphadenopathy:    She has no cervical adenopathy.  Neurological: She is alert and oriented to person, place, and time. Coordination normal.   Skin: Skin is warm and dry. No rash noted.  Psychiatric: She has a normal mood and affect. Her behavior is normal.      Assessment & Plan:   Debbie Hebert was seen today for nausea, chills and abdominal pain.  Diagnoses and all orders for this visit:  Nausea without vomiting  Adverse drug effect, initial encounter  Morbid obesity due to excess calories (HCC)   I am having Ms. Faught maintain her hydrochlorothiazide, metFORMIN, acetaminophen, and FLUoxetine.  No orders of the defined types were placed in this encounter.   Spent about 20 minutes with the patient explaining the mechanism of action of the indication. Also described her need to cut her portion sizes down and not overeat medication. She'll try that and cut her dose back to 2.4 mg a day and follow-up if there is no improvement in toleration of medication   Appropriate red flag conditions were discussed with the patient as well as actions that should be taken.  Patient expressed his understanding.  Follow-up: Return if symptoms worsen or fail to improve.  Carmelina Dane, MD

## 2015-05-01 ENCOUNTER — Ambulatory Visit (INDEPENDENT_AMBULATORY_CARE_PROVIDER_SITE_OTHER): Payer: Self-pay | Admitting: Family Medicine

## 2015-05-01 VITALS — BP 104/76 | HR 85 | Temp 98.2°F | Resp 16 | Ht 61.5 in | Wt 195.0 lb

## 2015-05-01 DIAGNOSIS — N912 Amenorrhea, unspecified: Secondary | ICD-10-CM

## 2015-05-01 DIAGNOSIS — O0000 Abdominal pregnancy without intrauterine pregnancy: Secondary | ICD-10-CM

## 2015-05-01 LAB — POCT URINE PREGNANCY: Preg Test, Ur: POSITIVE — AB

## 2015-05-01 NOTE — Progress Notes (Signed)
By signing my name below, I, Stann Ore, attest that this documentation has been prepared under the direction and in the presence of Elvina Sidle, MD. Electronically Signed: Stann Ore, Scribe. 05/01/2015 , 6:34 PM .  Patient was seen in room 11 .   Patient ID: Debbie Hebert MRN: [id number], DOB: 09/14/1970, 44 y.o. Date of Encounter: 05/01/2015  Primary Physician: [name], MD  Chief Complaint:  Chief Complaint  Patient presents with  . Generalized Body Aches    x 3 weeks  . Fatigue  . Emesis  . Abdominal Pain  . Chills    HPI:  Debbie Hebert is a 44 y.o. female who presents to [facility] complaining of generalized myalgia for 3 weeks with fatigue, emesis, low abd pain, nausea and chills. Her right foot has been swelling with fluid retention.   She's been using a medication (Saxenda) informed for weight loss. It's very expensive to the patient too ($1200 per year's supply). She denies any change in her weight. She denies diarrhea.   Past Medical History  Diagnosis Date  . Hypertension   . Dysmenorrhea      Home Meds: Prior to Admission medications   Medication Sig Start Date End Date Taking? Authorizing Provider  Liraglutide -Weight Management (SAXENDA) 18 MG/3ML SOPN Inject into the skin.   Yes Historical Provider, MD  acetaminophen (TYLENOL) 325 MG tablet Take 650 mg by mouth every 6 (six) hours as needed. Reported on 05/01/2015    Historical Provider, MD  FLUoxetine (PROZAC) 20 MG tablet Take 1 tablet (20 mg total) by mouth daily. Repeat to 2 tablets after 2 weeks Patient not taking: Reported on 04/21/2015 12/12/14   Pearline Cables, MD  hydrochlorothiazide (MICROZIDE) 12.5 MG capsule Take 12.5 mg by mouth daily. Reported on 05/01/2015    Historical Provider, MD  metFORMIN (GLUCOPHAGE) 500 MG tablet Take 500 mg by mouth 2 (two) times daily with a meal. Reported on 05/01/2015    Historical Provider, MD    Allergies: No  Known Allergies  Social History   Social History  . Marital Status: Married    Spouse Name: N/A  . Number of Children: N/A  . Years of Education: N/A   Occupational History  . Not on file.   Social History Main Topics  . Smoking status: Never Smoker   . Smokeless tobacco: Never Used  . Alcohol Use: No  . Drug Use: No  . Sexual Activity: Yes    Birth Control/ Protection: Condom   Other Topics Concern  . Not on file   Social History Narrative     Review of Systems: Constitutional: negative for fever, night sweats, weight changes; positive for fatigue, chills HEENT: negative for vision changes, hearing loss, congestion, rhinorrhea, ST, epistaxis, or sinus pressure Cardiovascular: negative for chest pain or palpitations Respiratory: negative for hemoptysis, wheezing, shortness of breath, or cough Abdominal: negative for diarrhea, or constipation; positive for vomiting, nausea, abd pain Dermatological: negative for rash Neurologic: negative for headache, dizziness, or syncope Musc: positive for myalgia (general body)  All other systems reviewed and are otherwise negative with the exception to those above and in the HPI.  Physical Exam: Blood pressure 104/76, pulse 85, temperature 98.2 F (36.8 C), resp. rate 16, height 5' 1.5" (1.562 m), weight 195 lb (88.451 kg), SpO2 98 %., Body mass index is 36.25 kg/(m^2). General: Well developed, well nourished, in no acute distress. Head: Normocephalic, atraumatic, eyes without discharge, sclera non-icteric, nares are  without discharge. Bilateral auditory canals clear, TM's are without perforation, pearly grey and translucent with reflective cone of light bilaterally. Oral cavity moist, posterior pharynx without exudate, erythema, peritonsillar abscess, or post nasal drip. Neck: Supple. No thyromegaly. Full ROM. No lymphadenopathy. Lungs: Clear bilaterally to auscultation without wheezes, rales, or rhonchi. Breathing is unlabored. Heart:  RRR with S1 S2. No murmurs, rubs, or gallops appreciated. Abdomen: Soft, non-distended. No hepatomegaly. No rebound/guarding. No obvious abdominal masses. Diffusely tender over entire abd, hyperactive bowel sounds Msk:  Strength and tone normal for age. Extremities/Skin: Warm and dry. No clubbing or cyanosis. No edema. No rashes or suspicious lesions. Neuro: Alert and oriented X 3. Moves all extremities spontaneously. Gait is normal. CNII-XII grossly in tact. Psych:  Responds to questions appropriately with a normal affect.   Labs: Results for orders placed or performed in visit on 05/01/15  POCT urine pregnancy  Result Value Ref Range   Preg Test, Ur Positive (A) Negative     ASSESSMENT AND PLAN:  44 y.o. year old female with pregnancy while on liraglutide, metformin and hydrochlorothiazide.  Two months since last menses.  Needs urgent ob evaluation  This chart was scribed in my presence and reviewed by me personally.  Patient to stop all meds.  Signed, Elvina SidleKurt Rajan Burgard, MD 05/01/2015 6:34 PM

## 2015-05-01 NOTE — Patient Instructions (Addendum)
Pare liraglutide, HCTZ, Metformin

## 2015-05-05 ENCOUNTER — Encounter: Payer: Self-pay | Admitting: Gynecology

## 2015-05-05 ENCOUNTER — Ambulatory Visit (INDEPENDENT_AMBULATORY_CARE_PROVIDER_SITE_OTHER): Payer: Self-pay | Admitting: Gynecology

## 2015-05-05 ENCOUNTER — Telehealth: Payer: Self-pay | Admitting: *Deleted

## 2015-05-05 ENCOUNTER — Ambulatory Visit (INDEPENDENT_AMBULATORY_CARE_PROVIDER_SITE_OTHER): Payer: Self-pay

## 2015-05-05 VITALS — BP 132/90 | Ht 63.0 in | Wt 200.0 lb

## 2015-05-05 DIAGNOSIS — E663 Overweight: Secondary | ICD-10-CM

## 2015-05-05 DIAGNOSIS — I10 Essential (primary) hypertension: Secondary | ICD-10-CM

## 2015-05-05 DIAGNOSIS — Z331 Pregnant state, incidental: Secondary | ICD-10-CM

## 2015-05-05 DIAGNOSIS — O24919 Unspecified diabetes mellitus in pregnancy, unspecified trimester: Secondary | ICD-10-CM | POA: Insufficient documentation

## 2015-05-05 DIAGNOSIS — Z3A01 Less than 8 weeks gestation of pregnancy: Secondary | ICD-10-CM

## 2015-05-05 DIAGNOSIS — O09521 Supervision of elderly multigravida, first trimester: Secondary | ICD-10-CM

## 2015-05-05 DIAGNOSIS — Z3490 Encounter for supervision of normal pregnancy, unspecified, unspecified trimester: Secondary | ICD-10-CM

## 2015-05-05 DIAGNOSIS — O24911 Unspecified diabetes mellitus in pregnancy, first trimester: Secondary | ICD-10-CM

## 2015-05-05 DIAGNOSIS — I159 Secondary hypertension, unspecified: Secondary | ICD-10-CM

## 2015-05-05 DIAGNOSIS — O09529 Supervision of elderly multigravida, unspecified trimester: Secondary | ICD-10-CM | POA: Insufficient documentation

## 2015-05-05 MED ORDER — METHYLDOPA 250 MG PO TABS: ORAL_TABLET | ORAL | Status: DC

## 2015-05-05 MED ORDER — METFORMIN HCL 500 MG PO TABS: 500.0000 mg | ORAL_TABLET | Freq: Two times a day (BID) | ORAL | Status: DC

## 2015-05-05 NOTE — Telephone Encounter (Signed)
Debbie Hebert relayed to Debbie Hebert.

## 2015-05-05 NOTE — Progress Notes (Addendum)
   Patient is a 44 year old gravida 2 para 2 (2 previous cesarean section) who stated that her last menstrual period was October 5. Patient had not been using any form of contraception. Has had some nausea and vomiting had a positive urine pregnancy test at home. Patient during her first pregnancy which was delivered at term had gestational diabetes and second pregnancy hypertension and preeclampsia delivered at 8 months gestation. Patient reports that approximately 10 years ago she had a lipomain [place] and 7 years ago for a benign cyst she had a right salpingo-oophorectomy.  Her PCP has been treating her for diabetes type 2 as well as hypertension for which she is currently taking the following: Metformin 500 mg daily Liraglutide daily subcutaneous injections HCTZ 12.5 mg daily  She is very anxious and concerned whether she is going to keep this pregnancy or not because of her advanced maternal age along with the medication that she's been on and difficult pregnancy she has had in the past. Since she is overweight and her pelvic examination today was somewhat limited and ultrasound was ordered which demonstrated the following:  Based on last menstrual period of 02/17/2015 patient would be 11 weeks into her pregnancy with an estimated date of confinement 11/24/2015.  Based on ultrasound today a viable intrauterine pregnancy at [redacted] weeks gestation with a due date of 12/08/2015 Gestational sac was seen in the lower uterine segment possibly near the cesarean scar. A yolk sac was seen and fetal pole with positive cardiac activity. Right ovary appeared normal left ovary not seen. Adnexa normal no free fluid noted  Assessment/plan: #1 pregnant advancement maternal age 749 weeks gestation #2 high-risk pregnancy hypertension and type 2 diabetes. I have asked her to discontinue the HCTZ and the subcutaneous daily insulin injections and to continue her metformin 500 mg twice a day as she was doing. I'm also  going to start her on Aldomet 250 mg one by mouth daily which both are safe during pregnancy. We are going to make an appointment for her to see the perinatologist within the next few days. Patient uncertain if she will terminate pregnancy or not. We discussed other risk factors with advanced maternal age to include aneuploidy, premature delivery, preeclampsia like she had in one of her prior pregnancies. Difficult management of her diabetes with potential risk for developing fetus. We also discussed risk of repeat cesarean section, uterine rupture, placenta accreta and increta or percreta if implantation is at the cesarean section scar. All these issues were discussed with the patient in Spanish. She will start her prenatal vitamins and follow up with a perinatologist. A copy of the ultrasound report was provided.

## 2015-05-05 NOTE — Telephone Encounter (Signed)
Dr.Fernandez asked me verbally to schedule this patient at high risk clinic due to HTN, diabetes, overweight. I called and pt is [facility] to [facility] entrance follow sign that says maternal fetal medicine.  Debarah CrapeClaudia will relay to patient

## 2015-05-05 NOTE — Patient Instructions (Signed)
Primer trimestre de embarazo  (First Trimester of Pregnancy)  El primer trimestre de embarazo se extiende desde la semana 1 hasta el final de la semana 12 (mes 1 al mes 3). Una semana después de que un espermatozoide fecunda un óvulo, este se implantará en la pared uterina. Este embrión comenzará a desarrollarse hasta convertirse en un bebé. Sus genes y los de su pareja forman el bebé. Los genes del varón determinan si será un niño o una niña. Entre la semana 6 y la 8, se forman los ojos y el rostro, y los latidos del corazón pueden verse en la ecografía. Al final de las 12 semanas, todos los órganos del bebé están formados.   Ahora que está embarazada, querrá hacer todo lo que esté a su alcance para tener un bebé sano. Dos de las cosas más importantes son tener una buena atención prenatal y seguir las indicaciones del médico. La atención prenatal incluye toda la asistencia médica que usted recibe antes del nacimiento del bebé. Esta ayudará a prevenir, detectar y tratar cualquier problema durante el embarazo y el parto.  CAMBIOS EN EL ORGANISMO  Su organismo atraviesa por muchos cambios durante el embarazo, y estos varían de una mujer a otra.   · Al principio, puede aumentar o bajar algunos kilos.  · Puede tener malestar estomacal (náuseas) y vomitar. Si no puede controlar los vómitos, llame al médico.  · Puede cansarse con facilidad.  · Es posible que tenga dolores de cabeza que pueden aliviarse con los medicamentos que el médico le permita tomar.  · Puede orinar con mayor frecuencia. El dolor al orinar puede significar que usted tiene una infección de la vejiga.  · Debido al embarazo, puede tener acidez estomacal.  · Puede estar estreñida, ya que ciertas hormonas enlentecen los movimientos de los músculos que empujan los desechos a través de los intestinos.  · Pueden aparecer hemorroides o abultarse e hincharse las venas (venas varicosas).  · Las mamas pueden empezar a agrandarse y estar sensibles. Los pezones  pueden sobresalir más, y el tejido que los rodea (areola) tornarse más oscuro.  · Las encías pueden sangrar y estar sensibles al cepillado y al hilo dental.  · Pueden aparecer zonas oscuras o manchas (cloasma, máscara del embarazo) en el rostro que probablemente se atenuarán después del nacimiento del bebé.  · Los períodos menstruales se interrumpirán.  · Tal vez no tenga apetito.  · Puede sentir un fuerte deseo de consumir ciertos alimentos.  · Puede tener cambios a nivel emocional día a día, por ejemplo, por momentos puede estar emocionada por el embarazo y por otros preocuparse porque algo pueda salir mal con el embarazo o el bebé.  · Tendrá sueños más vívidos y extraños.  · Tal vez haya cambios en el cabello que pueden incluir su engrosamiento, crecimiento rápido y cambios en la textura. A algunas mujeres también se les cae el cabello durante o después del embarazo, o tienen el cabello seco o fino. Lo más probable es que el cabello se le normalice después del nacimiento del bebé.  QUÉ DEBE ESPERAR EN LAS CONSULTAS PRENATALES  Durante una visita prenatal de rutina:  · La pesarán para asegurarse de que usted y el bebé están creciendo normalmente.  · Le controlarán la presión arterial.  · Le medirán el abdomen para controlar el desarrollo del bebé.  · Se escucharán los latidos cardíacos a partir de la semana 10 o la 12 de embarazo, aproximadamente.  · Se analizarán los resultados de los estudios solicitados en visitas anteriores.  El   médico puede preguntarle:  · Cómo se siente.  · Si siente los movimientos del bebé.  · Si ha tenido síntomas anormales, como pérdida de líquido, sangrado, dolores de cabeza intensos o cólicos abdominales.  · Si está consumiendo algún producto que contenga tabaco, como cigarrillos, tabaco de mascar y cigarrillos electrónicos.  · Si tiene alguna pregunta.  Otros estudios que pueden realizarse durante el primer trimestre incluyen lo siguiente:  · Análisis de sangre para determinar el tipo  de sangre y detectar la presencia de infecciones previas. Además, se los usará para controlar si los niveles de hierro son bajos (anemia) y determinar los anticuerpos Rh. En una etapa más avanzada del embarazo, se harán análisis de sangre para saber si tiene diabetes, junto con otros estudios si surgen problemas.  · Análisis de orina para detectar infecciones, diabetes o proteínas en la orina.  · Una ecografía para confirmar que el bebé crece y se desarrolla correctamente.  · Una amniocentesis para diagnosticar posibles problemas genéticos.  · Estudios del feto para descartar espina bífida y síndrome de Down.  · Es posible que necesite otras pruebas adicionales.  · Prueba del VIH (virus de inmunodeficiencia humana). Los exámenes prenatales de rutina incluyen la prueba de detección del VIH, a menos que decida no realizársela.  INSTRUCCIONES PARA EL CUIDADO EN EL HOGAR   Medicamentos:  · Siga las indicaciones del médico en relación con el uso de medicamentos. Durante el embarazo, hay medicamentos que pueden tomarse y otros que no.  · Tome las vitaminas prenatales como se le indicó.  · Si está estreñida, tome un laxante suave, si el médico lo autoriza.  Dieta  · Consuma alimentos balanceados. Elija alimentos variados, como carne o proteínas de origen vegetal, pescado, leche y productos lácteos descremados, verduras, frutas y panes y cereales integrales. El médico la ayudará a determinar la cantidad de peso que puede aumentar.  · No coma carne cruda ni quesos sin cocinar. Estos elementos contienen bacterias que pueden causar defectos congénitos en el bebé.  · La ingesta diaria de cuatro o cinco comidas pequeñas en lugar de tres comidas abundantes puede ayudar a aliviar las náuseas y los vómitos. Si empieza a tener náuseas, comer algunas galletas saladas puede ser de ayuda. Beber líquidos entre las comidas en lugar de tomarlos durante las comidas también puede ayudar a calmar las náuseas y los vómitos.  · Si está  estreñida, consuma alimentos con alto contenido de fibra, como verduras y frutas frescas, y cereales integrales. Beba suficiente líquido para mantener la orina clara o de color amarillo pálido.  Actividad y ejercicios  · Haga ejercicio solamente como se lo haya indicado el médico. El ejercicio la ayudará a:    Controlar el peso.    Mantenerse en forma.    Estar preparada para el trabajo de parto y el parto.  · Los dolores, los cólicos en la parte baja del abdomen o los calambres en la cintura son un buen indicio de que debe dejar de hacer ejercicios. Consulte al médico antes de seguir haciendo ejercicios normales.  · Intente no estar de pie durante mucho tiempo. Mueva las piernas con frecuencia si debe estar de pie en un lugar durante mucho tiempo.  · Evite levantar pesos excesivos.  · Use zapatos de tacones bajos y mantenga una buena postura.  · Puede seguir teniendo relaciones sexuales, excepto que el médico le indique lo contrario.  Alivio del dolor o las molestias  · Use un sostén que le brinde buen   soporte si siente dolor a la palpación en las mamas.  · Dese baños de asiento con agua tibia para aliviar el dolor o las molestias causadas por las hemorroides. Use crema antihemorroidal si el médico se lo permite.  · Descanse con las piernas elevadas si tiene calambres o dolor de cintura.  · Si tiene venas varicosas en las piernas, use medias de descanso. Eleve los pies durante 15 minutos, 3 o 4 veces por día. Limite la cantidad de sal en su dieta.  Cuidados prenatales  · Programe las visitas prenatales para la semana 12 de embarazo. Generalmente se programan cada mes al principio y se hacen más frecuentes en los 2 últimos meses antes del parto.  · Escriba sus preguntas. Llévelas cuando concurra a las visitas prenatales.  · Concurra a todas las visitas prenatales como se lo haya indicado el médico.  Seguridad  · Colóquese el cinturón de seguridad cuando conduzca.  · Haga una lista de los números de teléfono de  emergencia, que incluya los números de teléfono de familiares, amigos, el hospital y los departamentos de policía y bomberos.  Consejos generales  · Pídale al médico que la derive a clases de educación prenatal en su localidad. Debe comenzar a tomar las clases antes de entrar en el mes 6 de embarazo.  · Pida ayuda si tiene necesidades nutricionales o de asesoramiento durante el embarazo. El médico puede aconsejarla o derivarla a especialistas para que la ayuden con diferentes necesidades.  · No se dé baños de inmersión en agua caliente, baños turcos ni saunas.  · No se haga duchas vaginales ni use tampones o toallas higiénicas perfumadas.  · No mantenga las piernas cruzadas durante mucho tiempo.  · Evite el contacto con las bandejas sanitarias de los gatos y la tierra que estos animales usan. Estos elementos contienen bacterias que pueden causar defectos congénitos al bebé y la posible pérdida del feto debido a un aborto espontáneo o muerte fetal.  · No fume, no consuma hierbas ni medicamentos que no hayan sido recetados por el médico. Las sustancias químicas que estos productos contienen afectan la formación y el desarrollo del bebé.  · No consuma ningún producto que contenga tabaco, lo que incluye cigarrillos, tabaco de mascar y cigarrillos electrónicos. Si necesita ayuda para dejar de fumar, consulte al médico. Puede recibir asesoramiento y otro tipo de recursos para dejar de fumar.  · Programe una cita con el dentista. En su casa, lávese los dientes con un cepillo dental blando y pásese el hilo dental con suavidad.  SOLICITE ATENCIÓN MÉDICA SI:   · Tiene mareos.  · Siente cólicos leves, presión en la pelvis o dolor persistente en el abdomen.  · Tiene náuseas, vómitos o diarrea persistentes.  · Tiene secreción vaginal con mal olor.  · Siente dolor al orinar.  · Tiene el rostro, las manos, las piernas o los tobillos más hinchados.  SOLICITE ATENCIÓN MÉDICA DE INMEDIATO SI:   · Tiene fiebre.  · Tiene una pérdida de  líquido por la vagina.  · Tiene sangrado o pequeñas pérdidas vaginales.  · Siente dolor intenso o cólicos en el abdomen.  · Sube o baja de peso rápidamente.  · Vomita sangre de color rojo brillante o material que parezca granos de café.  · Ha estado expuesta a la rubéola y no ha sufrido la enfermedad.  · Ha estado expuesta a la quinta enfermedad o a la varicela.  · Tiene un dolor de cabeza intenso.  · Le falta el aire.  · Sufre   cualquier tipo de traumatismo, por ejemplo, debido a una caída o un accidente automovilístico.     Esta información no tiene como fin reemplazar el consejo del médico. Asegúrese de hacerle al médico cualquier pregunta que tenga.     Document Released: 02/02/2005 Document Revised: 05/16/2014  [employer]vier Interactive Patient Education ©2016 [employer]

## 2015-05-08 ENCOUNTER — Ambulatory Visit (HOSPITAL_COMMUNITY)
Admission: RE | Admit: 2015-05-08 | Discharge: 2015-05-08 | Disposition: A | Discharge status: Home / Self Care | Payer: Self-pay | Source: Ambulatory Visit | Attending: Gynecology | Admitting: Gynecology

## 2015-05-08 ENCOUNTER — Other Ambulatory Visit (HOSPITAL_COMMUNITY): Payer: Self-pay | Admitting: Maternal and Fetal Medicine

## 2015-05-08 ENCOUNTER — Encounter (HOSPITAL_COMMUNITY): Payer: Self-pay

## 2015-05-08 DIAGNOSIS — O09521 Supervision of elderly multigravida, first trimester: Secondary | ICD-10-CM | POA: Insufficient documentation

## 2015-05-08 DIAGNOSIS — O10011 Pre-existing essential hypertension complicating pregnancy, first trimester: Secondary | ICD-10-CM

## 2015-05-08 DIAGNOSIS — O10911 Unspecified pre-existing hypertension complicating pregnancy, first trimester: Secondary | ICD-10-CM | POA: Insufficient documentation

## 2015-05-08 DIAGNOSIS — Z3A01 Less than 8 weeks gestation of pregnancy: Secondary | ICD-10-CM

## 2015-05-08 DIAGNOSIS — Z3A09 9 weeks gestation of pregnancy: Secondary | ICD-10-CM

## 2015-05-08 DIAGNOSIS — O34219 Maternal care for unspecified type scar from previous cesarean delivery: Secondary | ICD-10-CM

## 2015-05-08 DIAGNOSIS — O24911 Unspecified diabetes mellitus in pregnancy, first trimester: Secondary | ICD-10-CM

## 2015-05-08 DIAGNOSIS — I159 Secondary hypertension, unspecified: Secondary | ICD-10-CM

## 2015-05-08 HISTORY — DX: Type 2 diabetes mellitus without complications: E11.9

## 2015-05-08 NOTE — Consult Note (Signed)
Maternal Fetal Medicine Consultation  Requesting Provider(s): Reynaldo Minium, MD  Reason for consultation: Advanced maternal age, multiple medications for weight loss, chronic hypertension  HPI: Debbie Hebert is a 44 yo G3P1102, EDD 02/17/2016 who is currently at 9w 3d - referred for consultation/ counseling due to multiple medications for weight loss and chronic hypertension.  The patient reports that she was given Phentermine and Liraglutide (victoza) for weight loss. The patient denies a history of diabetes outside of pregnancy and reports that she was taking the medications for weight loss only.  She also has a history of chronic hypertension which has been treated with HCTZ.  She was recently seen by Dr. Lily Peer and noted to have a positive pregnancy test.  Her medications were discontinued once it was determined that she was pregnant.     The patient's past OB history is remarkable for a prior term C-section for arrest of dilation and a repeat C-section at 36 weeks due to gestational diabetes and elevated blood pressures.  Ultrasound performed on 12/27 was remarkable for a gestational sac in the lower uterine segment and possibly in the C-section scar.  OB History: OB History    Gravida Para Term Preterm AB TAB SAB Ectopic Multiple Living   PMH:  Past Medical History  Diagnosis Date  . Hypertension   . Dysmenorrhea   . Diabetes mellitus without complication (HCC)     PSH:  Past Surgical History  Procedure Laterality Date  . Cesarean section      X2  . Abdominal surgery      LIPOSUCTION  . Oophorectomy      RIGHT, WAS DONE IN [place]  . Cosmetic surgery     Meds:  Current Outpatient Prescriptions on File Prior to Encounter  Medication Sig Dispense Refill  . acetaminophen (TYLENOL) 325 MG tablet Take 650 mg by mouth every 6 (six) hours as needed. Reported on 05/01/2015    . metFORMIN (GLUCOPHAGE) 500 MG tablet Take 1 tablet (500 mg total) by  mouth 2 (two) times daily with a meal. Reported on 05/05/2015 60 tablet 5  . methyldopa (ALDOMET) 250 MG tablet Take 1 tablet daily 30 tablet 5   No current facility-administered medications on file prior to encounter.    Allergies: No Known Allergies   FH:  Family History  Problem Relation Age of Onset  . Diabetes Mother   . Hypertension Sister   . Hypertension Paternal Grandmother    Soc:  Social History   Social History  . Marital Status: Married    Spouse Name: N/A  . Number of Children: N/A  . Years of Education: N/A   Occupational History  . Not on file.   Social History Main Topics  . Smoking status: Never Smoker   . Smokeless tobacco: Never Used  . Alcohol Use: No  . Drug Use: No  . Sexual Activity: Yes    Birth Control/ Protection: Condom   Other Topics Concern  . Not on file   Social History Narrative    Review of Systems: no vaginal bleeding or cramping/contractions, no LOF, no nausea/vomiting. All other systems reviewed and are negative.  PE:   Filed Vitals:   05/08/15 1033  BP: 127/78  Pulse: 67    GEN: well-appearing female ABD: gravid, NT  Ultrasound: Single IUP at 9w 3d A viable fetal embryo is noted with cardiac activity. The pregnancy appears to be  implanted in the lower uterine segment consistent with a C-section scar ectopic The gestational sac appears to balloon toward the fetal bladder.  The gestational sac does not appear to extend into the uterine fundus. The cervical stroma appears irregular ? chorionic tissue within the cervix.  Ultrasound findings and limitations were discussed with the patient - reviewed concerns / risks of uterine rupture and need to terminate the pregnancy.   A/P: 1) Single IUP at 9w 3d  2) C-section scar ectopic pregnancy - the patient and her husband were counseled on the potential life-threatening risks of what appears to be a C-section scar ectopic.  I contacted Dr. April MansonYalcinkaya who reviewed the images.   He will see her in his clinic this afternoon to discuss possible conservative management - plan for KCl injection and methotrexate as well as possible D&C once the pregnancy mass has decreased in size.  3) Phentermine and Liraglutide exposure - we briefly reviewed the teratogenic risks with both of these medications.  Liraglutide (Victoza) has been marketed for both type 2 diabetes and for weight loss.  Experimental animal studies have shown an equivocal increase in abnormal embryo development but no published human data is available.  Phentermine may be associated with an increased risk of congenital anomalies, but data is limited.  No significant increase in major malformations was found in 84 infants on phentermine/fenfluramine after first trimester exposure; however 4.3% of 70 liveborn infants of women who were treated with phentermine for obesity mostly in the first trimester were reported - although congential anomalies were different for each of the affected fetuses.  4) Hx of type 2 diabetes - once the C-section scar ectopic has been addressed, the patient will need to follow up with her PCM for further evaluation and management.    Thank you for the opportunity to be a part of the care of [name]. Please contact our office if we can be of further assistance.   I spent approximately 30 minutes with this patient with over 50% of time spent in face-to-face counseling.  Alpha GulaPaul Temiloluwa Recchia, MD Maternal Fetal Medicine

## 2015-05-12 ENCOUNTER — Encounter (HOSPITAL_COMMUNITY): Payer: Self-pay

## 2015-05-14 ENCOUNTER — Inpatient Hospital Stay (HOSPITAL_COMMUNITY)
Admission: AD | Admit: 2015-05-14 | Discharge: 2015-05-14 | Disposition: A | Discharge status: Home / Self Care | Payer: Medicaid Other | Source: Ambulatory Visit | Attending: Obstetrics and Gynecology | Admitting: Obstetrics and Gynecology

## 2015-05-14 ENCOUNTER — Encounter (HOSPITAL_COMMUNITY): Payer: Self-pay

## 2015-05-14 ENCOUNTER — Inpatient Hospital Stay (HOSPITAL_COMMUNITY): Payer: Medicaid Other

## 2015-05-14 DIAGNOSIS — I10 Essential (primary) hypertension: Secondary | ICD-10-CM | POA: Insufficient documentation

## 2015-05-14 DIAGNOSIS — E119 Type 2 diabetes mellitus without complications: Secondary | ICD-10-CM | POA: Diagnosis not present

## 2015-05-14 DIAGNOSIS — O009 Unspecified ectopic pregnancy without intrauterine pregnancy: Secondary | ICD-10-CM | POA: Diagnosis present

## 2015-05-14 DIAGNOSIS — Z3A14 14 weeks gestation of pregnancy: Secondary | ICD-10-CM | POA: Diagnosis not present

## 2015-05-14 DIAGNOSIS — O26899 Other specified pregnancy related conditions, unspecified trimester: Secondary | ICD-10-CM

## 2015-05-14 DIAGNOSIS — R109 Unspecified abdominal pain: Secondary | ICD-10-CM

## 2015-05-14 LAB — COMPREHENSIVE METABOLIC PANEL
ALT: 18 U/L (ref 14–54)
AST: 27 U/L (ref 15–41)
Albumin: 3.2 g/dL — ABNORMAL LOW (ref 3.5–5.0)
Alkaline Phosphatase: 150 U/L — ABNORMAL HIGH (ref 38–126)
Anion gap: 13 (ref 5–15)
BUN: 15 mg/dL (ref 6–20)
CO2: 16 mmol/L — ABNORMAL LOW (ref 22–32)
Calcium: 8.6 mg/dL — ABNORMAL LOW (ref 8.9–10.3)
Chloride: 106 mmol/L (ref 101–111)
Creatinine, Ser: 0.95 mg/dL (ref 0.44–1.00)
GFR calc Af Amer: >60 mL/min (ref >60)
GFR calc non Af Amer: >60 mL/min (ref >60)
Glucose, Bld: 132 mg/dL — ABNORMAL HIGH (ref 65–99)
Potassium: 3.3 mmol/L — ABNORMAL LOW (ref 3.5–5.1)
Sodium: 135 mmol/L (ref 135–145)
Total Bilirubin: 0.7 mg/dL (ref 0.3–1.2)
Total Protein: 6.6 g/dL (ref 6.5–8.1)

## 2015-05-14 LAB — DIC (DISSEMINATED INTRAVASCULAR COAGULATION)PANEL
D-Dimer, Quant: >20 ug{FEU}/mL — ABNORMAL HIGH (ref 0.00–0.50)
Fibrinogen: 456 mg/dL (ref 204–475)
INR: 1.09 (ref 0.00–1.49)
Platelets: 129 10*3/uL — ABNORMAL LOW (ref 150–400)
Prothrombin Time: 14.3 s (ref 11.6–15.2)
Smear Review: NONE SEEN
aPTT: 26 s (ref 24–37)

## 2015-05-14 LAB — CBC
HCT: 35 % — ABNORMAL LOW (ref 36.0–46.0)
Hemoglobin: 12 g/dL (ref 12.0–15.0)
MCH: 31.9 pg (ref 26.0–34.0)
MCHC: 34.3 g/dL (ref 30.0–36.0)
MCV: 93.1 fL (ref 78.0–100.0)
Platelets: 129 10*3/uL — ABNORMAL LOW (ref 150–400)
RBC: 3.76 MIL/uL — ABNORMAL LOW (ref 3.87–5.11)
RDW: 13.3 % (ref 11.5–15.5)
WBC: 3.3 10*3/uL — ABNORMAL LOW (ref 4.0–10.5)

## 2015-05-14 LAB — ABO/RH: ABO/RH(D): A POS

## 2015-05-14 LAB — TYPE AND SCREEN
ABO/RH(D): A POS
Antibody Screen: NEGATIVE

## 2015-05-14 LAB — HCG, QUANTITATIVE, PREGNANCY: hCG, Beta Chain, Quant, S: 51476 m[IU]/mL — ABNORMAL HIGH (ref <5)

## 2015-05-14 MED ORDER — MORPHINE SULFATE (PF) 4 MG/ML IV SOLN
4.0000 mg | Freq: Once | INTRAVENOUS | Status: AC
  Administered 2015-05-14: 4 mg via INTRAVENOUS
  Filled 2015-05-14: qty 1

## 2015-05-14 MED ORDER — PROMETHAZINE HCL 25 MG/ML IJ SOLN
12.5000 mg | Freq: Once | INTRAMUSCULAR | Status: AC
  Administered 2015-05-14: 12.5 mg via INTRAVENOUS
  Filled 2015-05-14: qty 1

## 2015-05-14 MED ORDER — LACTATED RINGERS IV SOLN
INTRAVENOUS | Status: DC
  Administered 2015-05-14: 03:00:00 via INTRAVENOUS

## 2015-05-14 NOTE — MAU Note (Signed)
Pt received from EMS to MAU with left sided abdominal pain since about 2100; had fever for past 3 days. Scheduled for surgery at 0900 this morning at [facility] for a known ectopic pregnancy.

## 2015-05-14 NOTE — MAU Provider Note (Signed)
History     CSN: [id number]  Arrival date and time: 05/14/15 4098   First Provider Initiated Contact with Patient 05/14/15 0109      Chief Complaint  Patient presents with  . Abdominal Pain  . Fever   HPI Comments: Debbie Hebert is a 46 y.o. G3P1102 at [redacted]w[redacted]d who presents today with abdominal pain. She has a known c-section scar ectopic pregnancy. She is scheduled for surgery later today. She denies any vaginal bleeding. EMS reports that on arrival at the patient's home her temp was 103. She had taken tylenol prior to their arrival. Patient was bundled in many layers and blankets. By the time she arrived here temp was normal.   Abdominal Pain This is a new problem. The onset quality is sudden. The problem occurs constantly. The problem has been unchanged. The pain is located in the generalized abdominal region. The pain is at a severity of 10/10. The abdominal pain does not radiate. Nothing aggravates the pain. The pain is relieved by nothing. She has tried acetaminophen for the symptoms. The treatment provided no relief.    Past Medical History  Diagnosis Date  . Hypertension   . Dysmenorrhea   . Diabetes mellitus without complication [facility])     Past Surgical History  Procedure Laterality Date  . Cesarean section      X2  . Abdominal surgery      LIPOSUCTION  . Oophorectomy      RIGHT, WAS DONE IN [place]  . Cosmetic surgery      Family History  Problem Relation Age of Onset  . Diabetes Mother   . Hypertension Sister   . Hypertension Paternal Grandmother     Social History  Substance Use Topics  . Smoking status: Never Smoker   . Smokeless tobacco: Never Used  . Alcohol Use: No    Allergies: No Known Allergies  Prescriptions prior to admission  Medication Sig Dispense Refill Last Dose  . acetaminophen (TYLENOL) 325 MG tablet Take 650 mg by mouth every 6 (six) hours as needed. Reported on 05/01/2015   Taking  . metFORMIN (GLUCOPHAGE) 500 MG tablet Take 1  tablet (500 mg total) by mouth 2 (two) times daily with a meal. Reported on 05/05/2015 60 tablet 5   . methyldopa (ALDOMET) 250 MG tablet Take 1 tablet daily 30 tablet 5     ROS Physical Exam   Blood pressure 130/58, pulse 105, temperature 99 F (37.2 C), resp. rate 18, last menstrual period 02/17/2015, SpO2 94 %.  Physical Exam  Nursing note and vitals reviewed. Constitutional: She is oriented to person, place, and time. She appears well-developed and well-nourished. No distress.  HENT:  Head: Normocephalic.  Cardiovascular: Normal rate.   Respiratory: Effort normal.  GI: Soft. She exhibits distension. There is tenderness. There is no rebound.  Neurological: She is alert and oriented to person, place, and time.  Skin: Skin is warm and dry.  Psychiatric: She has a normal mood and affect.   Results for orders placed or performed during the hospital encounter of 05/14/15 (from the past 24 hour(s))  CBC     Status: Abnormal   Collection Time: 05/14/15  1:05 AM  Result Value Ref Range   WBC 3.3 (L) 4.0 - 10.5 K/uL   RBC 3.76 (L) 3.87 - 5.11 MIL/uL   Hemoglobin 12.0 12.0 - 15.0 g/dL   HCT 11.9 (L) 14.7 - 82.9 %   MCV 93.1 78.0 - 100.0 fL   MCH 31.9 26.0 -  34.0 pg   MCHC 34.3 30.0 - 36.0 g/dL   RDW 16.1 09.6 - 04.5 %   Platelets 129 (L) 150 - 400 K/uL  Comprehensive metabolic panel     Status: Abnormal   Collection Time: 05/14/15  1:05 AM  Result Value Ref Range   Sodium 135 135 - 145 mmol/L   Potassium 3.3 (L) 3.5 - 5.1 mmol/L   Chloride 106 101 - 111 mmol/L   CO2 16 (L) 22 - 32 mmol/L   Glucose, Bld 132 (H) 65 - 99 mg/dL   BUN 15 6 - 20 mg/dL   Creatinine, Ser 4.09 0.44 - 1.00 mg/dL   Calcium 8.6 (L) 8.9 - 10.3 mg/dL   Total Protein 6.6 6.5 - 8.1 g/dL   Albumin 3.2 (L) 3.5 - 5.0 g/dL   AST 27 15 - 41 U/L   ALT 18 14 - 54 U/L   Alkaline Phosphatase 150 (H) 38 - 126 U/L   Total Bilirubin 0.7 0.3 - 1.2 mg/dL   GFR calc non Af Amer >60 >60 mL/min   GFR calc Af Amer >60  >60 mL/min   Anion gap 13 5 - 15  hCG, quantitative, pregnancy     Status: Abnormal   Collection Time: 05/14/15  1:05 AM  Result Value Ref Range   hCG, Beta Chain, Quant, S 51476 (H) <5 mIU/mL  Type and screen [facility]     Status: None   Collection Time: 05/14/15  1:05 AM  Result Value Ref Range   ABO/RH(D) A POS    Antibody Screen NEG    Sample Expiration 05/17/2015   DIC panel     Status: Abnormal (Preliminary result)   Collection Time: 05/14/15  1:05 AM  Result Value Ref Range   Prothrombin Time 14.3 11.6 - 15.2 seconds   INR 1.09 0.00 - 1.49   aPTT 26 24 - 37 seconds   Fibrinogen 456 204 - 475 mg/dL   D-Dimer, Quant PENDING 0.00 - 0.50 ug/mL-FEU   Platelets 129 (L) 150 - 400 K/uL   Smear Review NO SCHISTOCYTES SEEN   ABO/Rh     Status: None   Collection Time: 05/14/15  1:05 AM  Result Value Ref Range   ABO/RH(D) A POS     US Ob Comp Less 14 Wks  05/14/2015  CLINICAL DATA:  Pregnant patient with known C-section ectopic pregnancy, left-sided abdominal pain today. Patient scheduled for surgery later this morning. EXAM: OBSTETRIC <14 WK [place] AND TRANSVAGINAL OB US TECHNIQUE: Both transabdominal and transvaginal ultrasound examinations were performed for complete evaluation of the gestation as well as the maternal uterus, adnexal regions, and pelvic cul-de-sac. Transvaginal technique was performed to assess early pregnancy. COMPARISON:  Obstetric ultrasound 05/08/2015 FINDINGS: Intrauterine gestational sac: No longer present. Yolk sac:  No longer present. Embryo:  No longer seen. Maternal uterus/adnexae: There is a large 8.4 x 5.5 x 7.2 cm heterogeneous complex masslike region of echogenicity in the lower uterine segment at site of prior C-section ectopic. Heterogeneous material is within the cervix, similar in appearance to prior ultrasound. There is no free fluid in the pelvis. Right ovary appears normal. Left ovary is not seen. IMPRESSION: Known Caesarean section  ectopic with interval rupture, heterogeneous echogenic mass like region at site of prior gestational sac. There is no evidence of free fluid in the pelvis. These results were called by telephone at the time of interpretation on 05/14/2015 at 2:21 am [facility] CNM, who verbally acknowledged these results.  Electronically Signed   By: Rubye OaksMelanie  Ehinger M.D.   On: 05/14/2015 02:21   [place] Ob Transvaginal  05/14/2015  CLINICAL DATA:  Pregnant patient with known C-section ectopic pregnancy, left-sided abdominal pain today. Patient scheduled for surgery later this morning. EXAM: OBSTETRIC <14 WK US AND TRANSVAGINAL OB US TECHNIQUE: Both transabdominal and transvaginal ultrasound examinations were performed for complete evaluation of the gestation as well as the maternal uterus, adnexal regions, and pelvic cul-de-sac. Transvaginal technique was performed to assess early pregnancy. COMPARISON:  Obstetric ultrasound 05/08/2015 FINDINGS: Intrauterine gestational sac: No longer present. Yolk sac:  No longer present. Embryo:  No longer seen. Maternal uterus/adnexae: There is a large 8.4 x 5.5 x 7.2 cm heterogeneous complex masslike region of echogenicity in the lower uterine segment at site of prior C-section ectopic. Heterogeneous material is within the cervix, similar in appearance to prior ultrasound. There is no free fluid in the pelvis. Right ovary appears normal. Left ovary is not seen. IMPRESSION: Known Caesarean section ectopic with interval rupture, heterogeneous echogenic mass like region at site of prior gestational sac. There is no evidence of free fluid in the pelvis. These results were called by telephone at the time of interpretation on 05/14/2015 at 2:21 am [facility] CNM, who verbally acknowledged these results. Electronically Signed   By: Rubye OaksMelanie  Ehinger M.D.   On: 05/14/2015 02:21    MAU Course  Procedures  MDM 0126: D/W Dr. Eddie CandleYalckinkaya, will get labs and US at this time. Will call with results.   [id number]: D/W Dr. Eddie CandleYalckinkaya, and reviewed labs/US. Will give pain medication at this time. Will plan to do surgery in the morning if we can keep her comfortable here.  [id number]: D/W Dr. Despina HiddenEure, he will come see the patient.  0321: Dr. Despina HiddenEure in to see the patient. Resting comfortably with meds. Agrees with current plan. [id number]: D/W Dr. Eddie CandleYalckinkaya, he would like to have her transferred [facility].  0740: [facility] here. Patient in stable condition.   Assessment and Plan  Ectopic pregnancy Transfer [facility]   Thressa ShellerHogan, Satine Hausner [facility] 05/14/2015, 3:36 AM

## 2015-05-16 DIAGNOSIS — D649 Anemia, unspecified: Secondary | ICD-10-CM | POA: Insufficient documentation

## 2015-05-28 ENCOUNTER — Encounter: Payer: Self-pay | Admitting: Gynecology

## 2015-05-30 ENCOUNTER — Inpatient Hospital Stay (HOSPITAL_COMMUNITY)
Admission: AD | Admit: 2015-05-30 | Discharge: 2015-05-31 | Disposition: A | Discharge status: Home / Self Care | Payer: Medicaid Other | Source: Ambulatory Visit | Attending: Obstetrics & Gynecology | Admitting: Obstetrics & Gynecology

## 2015-05-30 ENCOUNTER — Encounter (HOSPITAL_COMMUNITY): Payer: Self-pay

## 2015-05-30 DIAGNOSIS — E119 Type 2 diabetes mellitus without complications: Secondary | ICD-10-CM

## 2015-05-30 DIAGNOSIS — R079 Chest pain, unspecified: Secondary | ICD-10-CM

## 2015-05-30 DIAGNOSIS — M546 Pain in thoracic spine: Secondary | ICD-10-CM

## 2015-05-30 DIAGNOSIS — M791 Myalgia, unspecified site: Secondary | ICD-10-CM

## 2015-05-30 HISTORY — DX: Gestational diabetes mellitus in pregnancy, unspecified control: O24.419

## 2015-05-30 LAB — CBC
HCT: 30.9 % — ABNORMAL LOW (ref 36.0–46.0)
Hemoglobin: 10.2 g/dL — ABNORMAL LOW (ref 12.0–15.0)
MCH: 31.2 pg (ref 26.0–34.0)
MCHC: 33 g/dL (ref 30.0–36.0)
MCV: 94.5 fL (ref 78.0–100.0)
Platelets: 470 10*3/uL — ABNORMAL HIGH (ref 150–400)
RBC: 3.27 MIL/uL — ABNORMAL LOW (ref 3.87–5.11)
RDW: 15.3 % (ref 11.5–15.5)
WBC: 6.7 10*3/uL (ref 4.0–10.5)

## 2015-05-30 LAB — BASIC METABOLIC PANEL
Anion gap: 8 (ref 5–15)
BUN: 15 mg/dL (ref 6–20)
CO2: 24 mmol/L (ref 22–32)
Calcium: 8.6 mg/dL — ABNORMAL LOW (ref 8.9–10.3)
Chloride: 106 mmol/L (ref 101–111)
Creatinine, Ser: 0.71 mg/dL (ref 0.44–1.00)
GFR calc Af Amer: >60 mL/min (ref >60)
GFR calc non Af Amer: >60 mL/min (ref >60)
Glucose, Bld: 107 mg/dL — ABNORMAL HIGH (ref 65–99)
Potassium: 4.1 mmol/L (ref 3.5–5.1)
Sodium: 138 mmol/L (ref 135–145)

## 2015-05-30 LAB — PROTIME-INR
INR: 1.07 (ref 0.00–1.49)
Prothrombin Time: 14.1 s (ref 11.6–15.2)

## 2015-05-30 LAB — D-DIMER, QUANTITATIVE: D-Dimer, Quant: 4.61 ug{FEU}/mL — ABNORMAL HIGH (ref 0.00–0.50)

## 2015-05-30 MED ORDER — CYCLOBENZAPRINE HCL 5 MG PO TABS
5.0000 mg | ORAL_TABLET | Freq: Once | ORAL | Status: AC
  Administered 2015-05-30: 5 mg via ORAL
  Filled 2015-05-30: qty 1

## 2015-05-30 NOTE — MAU Note (Addendum)
Pain under left breast and left shoulder and left lower back for 3 days.  Worsened at 9 am.  No bleeding. Ectopic 2 weeks ago and had surgery in [place] by Dr April Manson.

## 2015-05-30 NOTE — MAU Provider Note (Signed)
History     CSN: [id number] Arrival date and time: 05/30/15 2215 First Provider Initiated Contact with Patient 05/30/15 2248      Chief Complaint  Patient presents with  . Chest Pain  . Back Pain   HPI  LANNETTE AVELLINO is a 45 y.o. with recent CS scar ectopic that underwent DC and uterine artery embolization on 1/5 presenting with back and chest pain. Patient has history of diabetes and HTN. Record here at [facility] shows she is 12wk4d but she is NO LONGER PREGNANT after her procedure on 1/5 and I personally viewed the record in care everywhere.   Chest pain started 3 days ago, left side/ribs and left shoulder and back. Has not tried any medications. Seen by provider this week. Worse when in the bed/lying back and with deep inspiration and exertion. Better with standing. Paint radiates to her arm and neck. Pain is reproducible when she touches her left upper quadrant.   She reports onset of right calf pain 1 week ago after leaving the hospital. Denies SOB, palpitations. No recent trauma or falls. She reports walking around her yard in the last couple days which was new. No heavy lifting.    Past Medical History  Diagnosis Date  . Hypertension   . Dysmenorrhea   . Diabetes mellitus without complication (HCC)   . Gestational diabetes     Past Surgical History  Procedure Laterality Date  . Cesarean section      X2  . Abdominal surgery      LIPOSUCTION  . Oophorectomy      RIGHT, WAS DONE IN [place]  . Cosmetic surgery      Family History  Problem Relation Age of Onset  . Diabetes Mother   . Hypertension Sister   . Hypertension Paternal Grandmother     Social History  Substance Use Topics  . Smoking status: Never Smoker   . Smokeless tobacco: Never Used  . Alcohol Use: No    Allergies: No Known Allergies  Prescriptions prior to admission  Medication Sig Dispense Refill Last Dose  . acetaminophen (TYLENOL) 325 MG tablet Take 650 mg by mouth every 6 (six) hours  as needed. Reported on 05/01/2015   05/29/2015 at Unknown time  . ferrous sulfate 325 (65 FE) MG tablet Take 325 mg by mouth 2 (two) times daily with a meal.   05/30/2015 at Unknown time  . metFORMIN (GLUCOPHAGE) 500 MG tablet Take 1 tablet (500 mg total) by mouth 2 (two) times daily with a meal. Reported on 05/05/2015 60 tablet 5 05/30/2015 at Unknown time  . metroNIDAZOLE (FLAGYL) 500 MG tablet Take 500 mg by mouth 3 (three) times daily.   05/30/2015 at Unknown time  . methyldopa (ALDOMET) 250 MG tablet Take 1 tablet daily 30 tablet 5     Review of Systems  Constitutional: Negative for fever and chills.  Eyes: Negative for blurred vision and double vision.  Respiratory: Negative for cough and shortness of breath.   Cardiovascular: Negative for chest pain and orthopnea.  Gastrointestinal: Negative for nausea and vomiting.  Genitourinary: Negative for dysuria, frequency and flank pain.  Musculoskeletal: Negative for myalgias.  Skin: Negative for rash.  Neurological: Negative for dizziness, tingling, weakness and headaches.  Endo/Heme/Allergies: Does not bruise/bleed easily.  Psychiatric/Behavioral: Negative for depression and suicidal ideas. The patient is not nervous/anxious.    Physical Exam   Blood pressure 109/72, pulse 76, temperature 98.7 F (37.1 C), temperature source Oral, resp. rate 20, last menstrual period  02/17/2015, SpO2 96 %, unknown if currently breastfeeding.  Physical Exam  Nursing note and vitals reviewed. Constitutional: She is oriented to person, place, and time. She appears well-developed and well-nourished. She appears distressed (mild distress, uncomfortable appearing. holding left side. ).  HENT:  Head: Normocephalic and atraumatic.  Eyes: Conjunctivae are normal. No scleral icterus.  Neck: Normal range of motion. Neck supple.  Cardiovascular: Normal rate, regular rhythm, S1 normal, intact distal pulses and normal pulses.  PMI is not displaced.   No murmur  heard. Respiratory: Effort normal. She exhibits no tenderness.    GI: Soft. There is tenderness (LUQ, long lower rib edge. ). There is no rebound and no guarding.  No HSM  Musculoskeletal: Normal range of motion. She exhibits no edema.  + homan's on right calf. No redness or increased width of right compared to left. No palpable cords.   Neurological: She is alert and oriented to person, place, and time.  Skin: Skin is warm and dry. No rash noted.  Psychiatric: She has a normal mood and affect.    MAU Course  Procedures  MDM- ddx includes ACS -has risk factors but TIMI score is low and this is atypical chest pain by diamond forrester there is only 13% likelihood of ACS. Could be PE given recent surgery and pregnancy which represent hypercoaguable state along with history of right calf pain. Most likely MSK given reproducibility on exam.   EKG- normal sinus rythym, no ST elevations or depression. No STEMI present. No S1Q3T3 pattern or evidence of right heart strain.   BMP- slightly elevated glucose of 107 Coags- wnl Troponin- <0.03 D-dimer: elevated at 4.61  0000: given Flexeril for pain, alleviated from 8-9 to 7  Ct Angio Chest Pe W/cm &/or Wo Cm  05/31/2015  CLINICAL DATA:  45 year old female with left chest pain EXAM: CT ANGIOGRAPHY CHEST WITH CONTRAST TECHNIQUE: Multidetector CT imaging of the chest was performed using the standard protocol during bolus administration of intravenous contrast. Multiplanar CT image reconstructions and MIPs were obtained to evaluate the vascular anatomy. CONTRAST:  OMNIPAQUE IOHEXOL 350 MG/ML SOLN COMPARISON:  None. FINDINGS: Evaluation is limited due to streak artifact caused by patient's arms. There are linear areas of consolidation at the lung bases bilaterally which may represent atelectasis or scarring. Pneumonia is less likely. There is a small left pleural effusion. No pneumothorax. The central airways are patent. The thoracic aorta appears  unremarkable. For no CT evidence of pulmonary embolism. There is no cardiomegaly or pericardial effusion. Mild coronary vascular calcification. No hilar or mediastinal adenopathy. Esophagus is grossly unremarkable. No thyroid nodules identified. There is no axillary adenopathy. The chest wall soft tissues appear unremarkable mild degenerative changes of the thoracic spine. No acute fracture The visualized upper abdomen appears unremarkable. Review of the MIP images confirms the above findings. IMPRESSION: No CT evidence of pulmonary embolism. Bibasilar linear atelectasis/scarring. Pneumonia is less likely but not excluded. Clinical correlation is recommended. Small left pleural effusion. Electronically Signed   By: Elgie Collard M.D.   On: 05/31/2015 03:27    4:11 AM discussed negative results with patient. Testing has effectively ruled out ACS and PE. Will attempt to treat MSK pain. Flexeril helped minimally and will try perocet and NSAID.  6:20 AM improved pain score, feels ready to go home.   Assessment and Plan   AMELIANNA MELLER is a 45 y.o. [id number] presenting 16d postpartum from DC for CS ectopic with uterine artery embolization presenting with left sided chest  pain.  #Chest pain- ruled out ACS and PE. MSK is most likely.  - reports score of 0 and complete resolution of pain with toradol, percocet and flexeril - home with precautions - Rx for ibuprofen, flexeril and percoet provided - RTC in 1 wk if pain persists - Return to MAU if worsenign  Federico Flake 05/31/2015, 4:11 AM

## 2015-05-31 ENCOUNTER — Inpatient Hospital Stay (HOSPITAL_COMMUNITY): Payer: Medicaid Other

## 2015-05-31 DIAGNOSIS — E119 Type 2 diabetes mellitus without complications: Secondary | ICD-10-CM

## 2015-05-31 DIAGNOSIS — M791 Myalgia: Secondary | ICD-10-CM

## 2015-05-31 DIAGNOSIS — I10 Essential (primary) hypertension: Secondary | ICD-10-CM

## 2015-05-31 DIAGNOSIS — M546 Pain in thoracic spine: Secondary | ICD-10-CM

## 2015-05-31 DIAGNOSIS — R079 Chest pain, unspecified: Secondary | ICD-10-CM

## 2015-05-31 LAB — URINE MICROSCOPIC-ADD ON

## 2015-05-31 LAB — URINALYSIS, ROUTINE W REFLEX MICROSCOPIC
Bilirubin Urine: NEGATIVE
Glucose, UA: NEGATIVE mg/dL
Ketones, ur: NEGATIVE mg/dL
Nitrite: NEGATIVE
Protein, ur: NEGATIVE mg/dL
Specific Gravity, Urine: 1.025 (ref 1.005–1.030)
pH: 5.5 (ref 5.0–8.0)

## 2015-05-31 LAB — TROPONIN I: Troponin I: <0.03 ng/mL (ref <0.031)

## 2015-05-31 MED ORDER — OXYCODONE-ACETAMINOPHEN 5-325 MG PO TABS: 1.0000 | ORAL_TABLET | Freq: Four times a day (QID) | ORAL | Status: DC | PRN

## 2015-05-31 MED ORDER — KETOROLAC TROMETHAMINE 30 MG/ML IJ SOLN
30.0000 mg | Freq: Once | INTRAMUSCULAR | Status: AC
  Administered 2015-05-31: 30 mg via INTRAMUSCULAR
  Filled 2015-05-31: qty 1

## 2015-05-31 MED ORDER — IBUPROFEN 600 MG PO TABS: 600.0000 mg | ORAL_TABLET | Freq: Four times a day (QID) | ORAL | Status: DC | PRN

## 2015-05-31 MED ORDER — CYCLOBENZAPRINE HCL 5 MG PO TABS: 5.0000 mg | ORAL_TABLET | Freq: Three times a day (TID) | ORAL | Status: DC | PRN

## 2015-05-31 MED ORDER — IBUPROFEN 600 MG PO TABS: 600.0000 mg | ORAL_TABLET | Freq: Once | ORAL | Status: DC

## 2015-05-31 MED ORDER — CYCLOBENZAPRINE HCL 5 MG PO TABS
5.0000 mg | ORAL_TABLET | Freq: Once | ORAL | Status: AC
  Administered 2015-05-31: 5 mg via ORAL
  Filled 2015-05-31: qty 1

## 2015-05-31 MED ORDER — OXYCODONE-ACETAMINOPHEN 5-325 MG PO TABS
2.0000 | ORAL_TABLET | Freq: Once | ORAL | Status: AC
  Administered 2015-05-31: 2 via ORAL
  Filled 2015-05-31: qty 2

## 2015-05-31 MED ORDER — IOHEXOL 350 MG/ML SOLN
100.0000 mL | Freq: Once | INTRAVENOUS | Status: AC | PRN
  Administered 2015-05-31: 100 mL via INTRAVENOUS

## 2015-05-31 NOTE — Discharge Instructions (Signed)
You were seen for chest pain and left side pain. We ruled out a heart attack, a clot in your lungs and infection. You were treated with some strong pain medications. Continue to take ibuprofen  every 6 hours starting after dinner. Take this medication with food as it can upset your stomach.  You can take the flexeril every 6 hours as needed for the pain. You should take the percocetonly if the other medications do not work.   Ejercicios para la espalda (Back Exercises) Si tiene dolor de espalda, haga estos ejercicios 2 o 3veces por da, o como se lo haya indicado el mdico. Cuando el dolor desaparezca, hgalos una vez por da, pero haga ms repeticiones de cada ejercicio. Si no le duele la espalda, haga estos ejercicios una vez por da o como se lo haya indicado el mdico. EJERCICIOS Rodilla al pecho Repita estos pasos 3 o 5veces seguidas con cada pierna: 1. Acustese boca arriba sobre una cama dura o sobre el suelo con las piernas extendidas. 2. Lleve una rodilla al pecho. 3. Mantenga la rodilla contra el pecho. Para lograrlo tmese la rodilla o el muslo. 4. Tire de la rodilla hasta sentir una elongacin suave en la parte baja de la espalda. 5. Mantenga la elongacin durante 10 a 30segundos. 6. Suelte y extienda la pierna lentamente. Inclinacin de la pelvis Repita estos pasos 5 o 10veces seguidas: 1. Acustese boca arriba sobre una cama dura o sobre el suelo con las piernas extendidas. 2. Flexione las rodillas de manera que apunten al techo. Los pies deben estar apoyados en el suelo. 3. Contraiga los msculos de la parte baja del vientre (abdomen) para empujar la zona lumbar contra el suelo. Este movimiento har que el cccix apunte hacia el techo, en lugar de apuntar hacia abajo en direccin a los pies o al suelo. 4. Mantenga esta posicin durante 5 a 10segundos mientras contrae suavemente los msculos y respira con normalidad. El perro y el gato Repita estos pasos hasta que la zona  lumbar se curve con ms facilidad: 1. Apoye las palmas de las manos y las rodillas sobre una superficie firme. Las manos deben estar alineadas con los hombros y las rodillas con las caderas. Puede colocarse almohadillas debajo de las rodillas. 2. Deje caer la cabeza y lleve el cccix hacia abajo de modo que apunte en direccin al suelo para que la zona lumbar se arquee como el lomo de un gato [place]. 3. Mantenga esta posicin durante 5segundos. 4. Lentamente, levante la cabeza y lleve el cccix hacia arriba de modo que apunte en direccin al techo para que la espalda se arquee (hunda) como el lomo de un perro contento. 5. Mantenga esta posicin durante 5segundos. Flexiones de brazos Repita estos pasos 5 o 10veces seguidas: 1. Acustese boca abajo en el suelo. 2. Ponga las manos cerca de la cabeza, separadas aproximadamente al ancho de los hombros. 3. Con la espalda relajada y las caderas apoyadas en el suelo, extienda lentamente los brazos para levantar la mitad superior del cuerpo y Optometrist los hombros. No use los msculos de la espalda. Para estar ms cmodo, puede cambiar la [employer]. 4. Mantenga esta posicin durante 5segundos. 5. Lentamente vuelva a la posicin horizontal. Puentes Repita estos pasos 10veces seguidas: 1. Acustese boca arriba sobre una superficie firme. 2. Flexione las rodillas de manera que apunten al techo. Los pies deben estar apoyados en el suelo. 3. Contraiga los glteos y despegue las nalgas del suelo hasta que la  cintura est casi a la altura de las rodillas. Si no siente el trabajo muscular en las nalgas y la parte posterior de los muslos, aleje los pies 1 o 2pulgadas (2,5 o 5centmetros) de las nalgas. 4. Mantenga esta posicin durante 3 a 5segundos. 5. Lentamente, vuelva a apoyar las nalgas en el suelo y relaje los glteos. Si este ejercicio le resulta muy fcil, intente realizarlo con los brazos cruzados [place]. Abdominales Repita  estos pasos 5 o 10veces seguidas: 1. Acustese boca arriba sobre una cama dura o sobre el suelo con las piernas extendidas. 2. Flexione las rodillas de manera que apunten al techo. Los pies deben estar apoyados en el suelo. 3. Cruce los [employer]. 4. Baje levemente el mentn en direccin al pecho, pero no doble el cuello. 5. Contraiga los msculos del abdomen y con lentitud eleve el pecho lo suficiente como para despegar levemente los omplatos del suelo. 6. Lentamente baje el pecho y la cabeza hasta el suelo. Elevaciones de espalda Repita estos pasos 5 o 10veces seguidas: 1. Acustese boca abajo con los brazos a los costados y apoye la frente en el suelo. 2. Contraiga los msculos de las piernas y los glteos. 3. Lentamente despegue el pecho del suelo mientras mantiene las caderas apoyadas en el suelo. Mantenga la nuca alineada con la curvatura de la espalda. Mire hacia el suelo mientras hace este ejercicio. 4. Mantenga esta posicin durante 3 a 5segundos. 5. Lentamente baje el pecho y el rostro hasta el suelo. SOLICITE AYUDA SI:  El dolor de espalda se vuelve mucho ms intenso cuando hace un ejercicio.  El dolor de espalda no se [place] 2horas despus de [employer] ejercicios. Si tiene alguno de [employer], deje de [employer] ejercicios. No vuelva a hacer los ejercicios a menos que el mdico lo autorice. SOLICITE AYUDA DE INMEDIATO SI:  Siente un dolor sbito y muy intenso en la espalda. Si esto ocurre, deje de [employer]. No vuelva a hacer los ejercicios a menos que el mdico lo autorice.   Esta informacin no tiene Theme park manager el consejo del mdico. Asegrese de hacerle al mdico cualquier pregunta que tenga.   Document Released: 08/10/2010 Document Revised: 01/14/2015 Elsevier Interactive Patient Education [employer]

## 2015-06-05 ENCOUNTER — Encounter (HOSPITAL_COMMUNITY): Payer: Self-pay | Admitting: *Deleted

## 2015-06-05 DIAGNOSIS — R1013 Epigastric pain: Secondary | ICD-10-CM | POA: Diagnosis not present

## 2015-06-05 DIAGNOSIS — E119 Type 2 diabetes mellitus without complications: Secondary | ICD-10-CM | POA: Diagnosis not present

## 2015-06-05 DIAGNOSIS — Z9889 Other specified postprocedural states: Secondary | ICD-10-CM | POA: Insufficient documentation

## 2015-06-05 DIAGNOSIS — R61 Generalized hyperhidrosis: Secondary | ICD-10-CM | POA: Insufficient documentation

## 2015-06-05 DIAGNOSIS — M549 Dorsalgia, unspecified: Secondary | ICD-10-CM | POA: Diagnosis not present

## 2015-06-05 DIAGNOSIS — Z79899 Other long term (current) drug therapy: Secondary | ICD-10-CM | POA: Diagnosis not present

## 2015-06-05 DIAGNOSIS — Z8742 Personal history of other diseases of the female genital tract: Secondary | ICD-10-CM | POA: Insufficient documentation

## 2015-06-05 DIAGNOSIS — R1084 Generalized abdominal pain: Secondary | ICD-10-CM | POA: Diagnosis present

## 2015-06-05 DIAGNOSIS — J159 Unspecified bacterial pneumonia: Secondary | ICD-10-CM | POA: Diagnosis not present

## 2015-06-05 DIAGNOSIS — Z792 Long term (current) use of antibiotics: Secondary | ICD-10-CM | POA: Diagnosis not present

## 2015-06-05 DIAGNOSIS — R1011 Right upper quadrant pain: Secondary | ICD-10-CM | POA: Diagnosis not present

## 2015-06-05 DIAGNOSIS — I10 Essential (primary) hypertension: Secondary | ICD-10-CM | POA: Insufficient documentation

## 2015-06-05 LAB — URINALYSIS, ROUTINE W REFLEX MICROSCOPIC
Bilirubin Urine: NEGATIVE
Glucose, UA: NEGATIVE mg/dL
Ketones, ur: NEGATIVE mg/dL
Nitrite: NEGATIVE
Protein, ur: NEGATIVE mg/dL
Specific Gravity, Urine: 1.016 (ref 1.005–1.030)
pH: 5 (ref 5.0–8.0)

## 2015-06-05 LAB — URINE MICROSCOPIC-ADD ON

## 2015-06-05 LAB — CBC
HCT: 32.6 % — ABNORMAL LOW (ref 36.0–46.0)
Hemoglobin: 10.9 g/dL — ABNORMAL LOW (ref 12.0–15.0)
MCH: 32.4 pg (ref 26.0–34.0)
MCHC: 33.4 g/dL (ref 30.0–36.0)
MCV: 97 fL (ref 78.0–100.0)
Platelets: 259 10*3/uL (ref 150–400)
RBC: 3.36 MIL/uL — ABNORMAL LOW (ref 3.87–5.11)
RDW: 15.6 % — ABNORMAL HIGH (ref 11.5–15.5)
WBC: 10.5 10*3/uL (ref 4.0–10.5)

## 2015-06-05 LAB — I-STAT BETA HCG BLOOD, ED (MC, WL, AP ONLY): I-stat hCG, quantitative: 29.9 m[IU]/mL — ABNORMAL HIGH (ref <5)

## 2015-06-05 NOTE — ED Notes (Signed)
The pt is c/o abd pain rt rib pain coughing productive white sputum.  lmp 3 months ago.. She had a miscarriage 3 weeks ago

## 2015-06-06 ENCOUNTER — Emergency Department (HOSPITAL_COMMUNITY): Payer: Medicaid Other

## 2015-06-06 ENCOUNTER — Emergency Department (HOSPITAL_COMMUNITY)
Admission: EM | Admit: 2015-06-06 | Discharge: 2015-06-06 | Disposition: A | Discharge status: Home / Self Care | Payer: Medicaid Other | Attending: Emergency Medicine | Admitting: Emergency Medicine

## 2015-06-06 DIAGNOSIS — J189 Pneumonia, unspecified organism: Secondary | ICD-10-CM

## 2015-06-06 DIAGNOSIS — R1010 Upper abdominal pain, unspecified: Secondary | ICD-10-CM

## 2015-06-06 LAB — COMPREHENSIVE METABOLIC PANEL
ALT: 23 U/L (ref 14–54)
AST: 22 U/L (ref 15–41)
Albumin: 3 g/dL — ABNORMAL LOW (ref 3.5–5.0)
Alkaline Phosphatase: 75 U/L (ref 38–126)
Anion gap: 9 (ref 5–15)
BUN: 10 mg/dL (ref 6–20)
CO2: 23 mmol/L (ref 22–32)
Calcium: 9 mg/dL (ref 8.9–10.3)
Chloride: 104 mmol/L (ref 101–111)
Creatinine, Ser: 0.73 mg/dL (ref 0.44–1.00)
GFR calc Af Amer: >60 mL/min (ref >60)
GFR calc non Af Amer: >60 mL/min (ref >60)
Glucose, Bld: 163 mg/dL — ABNORMAL HIGH (ref 65–99)
Potassium: 4.1 mmol/L (ref 3.5–5.1)
Sodium: 136 mmol/L (ref 135–145)
Total Bilirubin: 0.4 mg/dL (ref 0.3–1.2)
Total Protein: 7.1 g/dL (ref 6.5–8.1)

## 2015-06-06 LAB — LIPASE, BLOOD: Lipase: 26 U/L (ref 11–51)

## 2015-06-06 MED ORDER — DICYCLOMINE HCL 20 MG PO TABS: 20.0000 mg | ORAL_TABLET | Freq: Two times a day (BID) | ORAL | Status: DC

## 2015-06-06 MED ORDER — HYDROCODONE-ACETAMINOPHEN 5-325 MG PO TABS: 2.0000 | ORAL_TABLET | ORAL | Status: DC | PRN

## 2015-06-06 MED ORDER — HYDROMORPHONE HCL 1 MG/ML IJ SOLN
1.0000 mg | Freq: Once | INTRAMUSCULAR | Status: AC
  Administered 2015-06-06: 1 mg via INTRAVENOUS
  Filled 2015-06-06: qty 1

## 2015-06-06 MED ORDER — ONDANSETRON HCL 4 MG/2ML IJ SOLN
4.0000 mg | Freq: Once | INTRAMUSCULAR | Status: AC
  Administered 2015-06-06: 4 mg via INTRAVENOUS
  Filled 2015-06-06: qty 2

## 2015-06-06 MED ORDER — FAMOTIDINE 20 MG PO TABS: 20.0000 mg | ORAL_TABLET | Freq: Two times a day (BID) | ORAL | Status: DC

## 2015-06-06 MED ORDER — LEVOFLOXACIN 750 MG PO TABS: 750.0000 mg | ORAL_TABLET | Freq: Every day | ORAL | Status: DC

## 2015-06-06 NOTE — Discharge Instructions (Signed)
Neumonía extrahospitalaria en los adultos °(Community-Acquired Pneumonia, Adult) °La neumonía es una infección en los pulmones. Hay diferentes tipos de neumonía. Uno de ellos puede desarrollarse mientras una persona está en el hospital. Un tipo diferente, llamado neumonía extrahospitalaria, evoluciona en las personas que no están o no han estado recientemente en el hospital u otro centro de salud.  °CAUSAS °La neumonía puede ser bacteriana, viral o micótica. Con frecuencia, la causa de la neumonía extrahospitalaria es la bacteria Streptococcus pneumoniae. Estas bacterias suelen transmitirse de una persona a otra al inhalar las gotitas que un individuo infectado libera al toser o estornudar. °FACTORES DE RIESGO °Es más probable que la enfermedad se manifieste en: °· Las personas que padecen enfermedades crónicas, como enfermedad pulmonar obstructiva crónica (EPOC), asma, insuficiencia cardíaca congestiva, fibroso quística, diabetes o enfermedad renal. °· Las personas que tienen el VIH en etapa inicial o avanzada. °· Las personas que tienen anemia drepanocítica. °· Las personas a las que se les extrajo el bazo (esplenectomía). °· Las personas cuya higiene dental es deficiente. °· Las personas que sufren enfermedades que aumentan el riesgo de inspirar (aspirar) las secreciones de la propia boca y la nariz.   °· Las personas cuyo sistema inmunitario está debilitado (inmunodeprimido). °· Los fumadores. °· Las personas que viajan a regiones donde es frecuente la existencia de los gérmenes que causan la neumonía. °· Las personas que tienen contacto con hábitat de animales o con animales que son portadores de los gérmenes que causan neumonía, entre ellos, pájaros, murciélagos, conejos, gatos y animales de granja. °SÍNTOMAS °Los síntomas de esta enfermedad incluyen lo siguiente: °· Tos seca. °· Tos con expectoración (productiva). °· Fiebre. °· Sudoración. °· Dolor de pecho, especialmente al respirar profundamente o al  toser. °· Respiración rápida o dificultad para respirar. °· Falta de aire. °· Escalofríos. °· Fatiga. °· Dolores musculares. °DIAGNÓSTICO °El médico le hará una historia clínica y un examen físico. También pueden hacerle otros estudios, por ejemplo: °· Estudios de diagnóstico por imágenes, entre ellos, radiografías. °· Análisis para controlar el nivel de oxígeno y de otros gases en la sangre. °· Otros análisis de sangre, de la mucosidad (esputo), el líquido que rodea los pulmones (líquido pleural) y la orina. °Si la neumonía es grave, se pueden hacer otros estudios para identificar la causa específica de la enfermedad. °TRATAMIENTO °El tipo de tratamiento que se administra depende de muchos factores, por ejemplo, la causa de la neumonía, los medicamentos que toma y otras enfermedades que padezca. En el caso de la mayoría de los adultos, el tratamiento de la neumonía y la recuperación pueden hacerse en la casa. En algunos casos, el tratamiento debe administrarse en un hospital. El tratamiento puede incluir lo siguiente: °· Antibióticos, si la neumonía fue causada por bacterias. °· Antivirales, si la neumonía fue causada por un virus. °· Medicamentos que se administran por vía oral o por vía intravenosa (IV). °· Oxígeno. °· Terapia respiratoria. °Aunque es poco frecuente, el tratamiento de la neumonía grave puede incluir lo siguiente: °· Asistencia respiratoria mecánica. Este tratamiento se realiza si no respira bien por sí solo y no puede mantener un nivel de oxígeno adecuado. °· Toracocentesis. Este procedimiento se realiza para extraer el líquido que rodea uno o ambos pulmones, a fin de mejorar la respiración. °INSTRUCCIONES PARA EL CUIDADO EN EL HOGAR °· Tome los medicamentos de venta libre y los recetados solamente como se lo haya indicado el médico. °· Tome los medicamentos para la tos solamente si no puede dormir bien. Debe entender que   los medicamentos para la tos pueden minar la capacidad natural del  organismo de Pharmacologist la mucosidad de los pulmones.  Si le recetaron un antibitico, tmelo como se lo haya indicado el mdico. No deje de tomar los antibiticos aunque comience a Actor.  De noche, duerma semisentado. Intente dormir en un silln reclinable o pngase algunas almohadas debajo de la cabeza.  No consuma productos que contengan tabaco, incluidos cigarrillos, tabaco de Theatre manager y Administrator, Civil Service. Si necesita ayuda para dejar de fumar, consulte al mdico.  Beba suficiente agua para mantener la orina clara o de color amarillo plido. Esto ayudar a fluidificar las secreciones mucosas de los pulmones. PREVENCIN Existen formas de reducir el riesgo de tener neumona extrahospitalaria. Considere la posibilidad de aplicarse la vacuna antineumoccica rene estos requisitos:  Es mayor de 16XWR.  Es mayor de 19aos y est recibiendo Garment/textile technologist, tiene enfermedad pulmonar crnica u otros trastornos que le afectan el sistema inmunitario. Pregntele al mdico si esto es vlido para su caso. Hay diferentes tipos de vacunas antineumoccicas y cronogramas para su aplicacin. Pregntele al mdico cul es la opcin de vacunacin ms [place] para usted. Tambin puede evitar contraer neumona extrahospitalaria si toma las siguientes medidas:  Se aplica la vacuna antigripal todos los [place]. Pregntele al mdico cul es el tipo de vacuna antigripal ms adecuado para usted.  Visita al dentista peridicamente.  Se lava las manos con frecuencia. Utilice un desinfectante para manos si no dispone de [place] y [place]. SOLICITE ATENCIN MDICA SI:  Lance Muss.  No duerme bien porque no es posible controlar la tos con medicamentos para la tos. SOLICITE ATENCIN MDICA DE INMEDIATO SI:  Experimenta un empeoramiento en la falta de aire.  El dolor de pecho es cada vez ms intenso.  La enfermedad empeora, especialmente si usted es un adulto mayor o su sistema inmunitario est  debilitado.  Tose y escupe sangre.   Esta informacin no tiene Theme park manager el consejo del mdico. Asegrese de hacerle al mdico cualquier pregunta que tenga.   Document Released: 02/02/2005 Document Revised: 01/14/2015 Elsevier Interactive Patient Education 2016 [employer]. Dolor abdominal en adultos (Abdominal Pain, Adult) El dolor puede tener muchas causas. Normalmente la causa del dolor abdominal no es una enfermedad y Scientist, clinical (histocompatibility and immunogenetics) sin TEFL teacher. Frecuentemente puede controlarse y tratarse en casa. Su mdico le Medical sales representative examen fsico y posiblemente solicite anlisis de sangre y radiografas para ayudar a Chief Strategy Officer la gravedad de su dolor. Sin embargo, en [employer], debe transcurrir ms tiempo antes de que se pueda Clinical research associate una causa evidente del dolor. Antes de llegar a ese punto, es posible que su mdico no sepa si necesita ms pruebas o un tratamiento ms profundo. INSTRUCCIONES PARA EL CUIDADO EN EL HOGAR  Est atento al dolor para ver si hay cambios. Las siguientes indicaciones ayudarn a Architectural technologist que pueda sentir:  [place] solo medicamentos de venta libre o recetados, segn las indicaciones del mdico.  No tome laxantes a menos que se lo haya indicado su mdico.  Pruebe con Neomia Dear dieta lquida absoluta (caldo, t o agua) segn se lo indique su mdico. Introduzca gradualmente una dieta normal, segn su tolerancia. SOLICITE ATENCIN MDICA SI:  Tiene dolor abdominal sin explicacin.  Tiene dolor abdominal relacionado con nuseas o diarrea.  Tiene dolor cuando orina o defeca.  Experimenta dolor abdominal que lo despierta de noche.  Tiene dolor abdominal que empeora o mejora cuando come alimentos.  Tiene dolor abdominal que empeora cuando come alimentos grasosos.  Tiene fiebre. SOLICITE ATENCIN MDICA DE INMEDIATO SI:   El dolor no desaparece en un plazo mximo de 2horas.  No deja de (vomitar).  El Engineer, mining se siente solo en partes del  abdomen, como el lado derecho o la parte inferior izquierda del abdomen.  Evaca materia fecal sanguinolenta o negra, de aspecto alquitranado. ASEGRESE DE QUE:  Comprende estas instrucciones.  Controlar su afeccin.  Recibir ayuda de inmediato si no mejora o si empeora.   Esta informacin no tiene Theme park manager el consejo del mdico. Asegrese de hacerle al mdico cualquier pregunta que tenga.   Document Released: 04/25/2005 Document Revised: 05/16/2014 Elsevier Interactive Patient Education [employer]

## 2015-06-06 NOTE — ED Provider Notes (Signed)
CSN: [id number]     Arrival date & time 06/05/15  2258 History    By signing my name below, I, Debbie Hebert, attest that this documentation has been prepared under the direction and in the presence of Debbie Crease, MD.  Electronically Signed: Arlan Hebert, ED Scribe. 06/06/2015. 12:56 AM.    Chief Complaint  Patient presents with  . Abdominal Pain   The history is provided by the patient. No language interpreter was used.    HPI Comments: Debbie Hebert is a 45 y.o. female with a PMHx of HTN and DM who presents to the Emergency Department complaining of intermittent, ongoing diffuse abdominal pain that radiates to the back x 2-3 weeks; worsened this evening. Pt also reports urinary frequency and a productive cough consisting of white sputum. No aggravating or alleviating factors at this time. No OTC medications or home remedies attempted at home. No recent fever, chills, nausea, or vomiting. Debbie Hebert was evaluated 1 week ago at [facility] for same and advised to come to the Emergency Department if symptoms persisted. Pt had a cesarean section scar ectopic pregnancy 3 weeks ago and underwent D and C and uterine artery embolization. PSHx also includes cesarean section x 2, and oophorectomy.  PCP: [name], MD    Past Medical History  Diagnosis Date  . Hypertension   . Dysmenorrhea   . Diabetes mellitus without complication (HCC)   . Gestational diabetes    Past Surgical History  Procedure Laterality Date  . Cesarean section      X2  . Abdominal surgery      LIPOSUCTION  . Oophorectomy      RIGHT, WAS DONE IN [place]  . Cosmetic surgery     Family History  Problem Relation Age of Onset  . Diabetes Mother   . Hypertension Sister   . Hypertension Paternal Grandmother    Social History  Substance Use Topics  . Smoking status: Never Smoker   . Smokeless tobacco: Never Used  . Alcohol Use: No   OB History    Gravida Para Term Preterm AB TAB SAB  Ectopic Multiple Living   3 2 1 1      2      Review of Systems  Constitutional: Positive for diaphoresis. Negative for fever and chills.  Respiratory: Positive for cough.   Gastrointestinal: Positive for abdominal pain. Negative for nausea and vomiting.  Musculoskeletal: Positive for back pain.  Psychiatric/Behavioral: Negative for confusion.  All other systems reviewed and are negative.     Allergies  Review of patient's allergies indicates no known allergies.  Home Medications   Prior to Admission medications   Medication Sig Start Date End Date Taking? Authorizing Provider  acetaminophen (TYLENOL) 325 MG tablet Take 650 mg by mouth every 6 (six) hours as needed. Reported on 05/01/2015   Yes Historical Provider, MD  cyclobenzaprine (FLEXERIL) 5 MG tablet Take 1 tablet (5 mg total) by mouth 3 (three) times daily as needed for muscle spasms. 05/31/15  Yes Federico Flake, MD  ferrous sulfate 325 (65 FE) MG tablet Take 325 mg by mouth 2 (two) times daily with a meal.   Yes Historical Provider, MD  ibuprofen (ADVIL,MOTRIN) 600 MG tablet Take 1 tablet (600 mg total) by mouth every 6 (six) hours as needed. 05/31/15  Yes Federico Flake, MD  metFORMIN (GLUCOPHAGE) 500 MG tablet Take 1 tablet (500 mg total) by mouth 2 (two) times daily with a meal. Reported on 05/05/2015 05/05/15  Yes Ok Edwards, MD  methyldopa (ALDOMET) 250 MG tablet Take 1 tablet daily 05/05/15  Yes Ok Edwards, MD  metroNIDAZOLE (FLAGYL) 500 MG tablet Take 500 mg by mouth 3 (three) times daily.   Yes Historical Provider, MD  oxyCODONE-acetaminophen (PERCOCET/ROXICET) 5-325 MG tablet Take 1-2 tablets by mouth every 6 (six) hours as needed for severe pain. 05/31/15  Yes Federico Flake, MD  dicyclomine (BENTYL) 20 MG tablet Take 1 tablet (20 mg total) by mouth 2 (two) times daily. 06/06/15   Debbie Crease, MD  famotidine (PEPCID) 20 MG tablet Take 1 tablet (20 mg total) by mouth 2 (two)  times daily. 06/06/15   Debbie Crease, MD  HYDROcodone-acetaminophen (NORCO/VICODIN) 5-325 MG tablet Take 2 tablets by mouth every 4 (four) hours as needed for moderate pain. 06/06/15   Debbie Crease, MD  levofloxacin (LEVAQUIN) 750 MG tablet Take 1 tablet (750 mg total) by mouth daily. 06/06/15   Debbie Crease, MD   Triage Vitals: BP 99/63 mmHg  Pulse 77  Temp(Src) 98.2 F (36.8 C) (Oral)  Resp 16  SpO2 92%  LMP 02/17/2015   Physical Exam  Constitutional: She is oriented to person, place, and time. She appears well-developed and well-nourished. No distress.  Pacing in the room  HENT:  Head: Normocephalic and atraumatic.  Right Ear: Hearing normal.  Left Ear: Hearing normal.  Nose: Nose normal.  Mouth/Throat: Oropharynx is clear and moist and mucous membranes are normal.  Eyes: Conjunctivae and EOM are normal. Pupils are equal, round, and reactive to light.  Neck: Normal range of motion. Neck supple.  Cardiovascular: Regular rhythm, S1 normal and S2 normal.  Exam reveals no gallop and no friction rub.   No murmur heard. Pulmonary/Chest: Effort normal and breath sounds normal. No respiratory distress. She exhibits no tenderness.  Abdominal: Soft. Normal appearance and bowel sounds are normal. There is no hepatosplenomegaly. There is tenderness. There is no rebound, no tenderness at McBurney's point and negative Murphy's sign. No hernia.  Tender to epigastrium area and RUQ  Musculoskeletal: Normal range of motion.  Neurological: She is alert and oriented to person, place, and time. She has normal strength. No cranial nerve deficit or sensory deficit. Coordination normal. GCS eye subscore is 4. GCS verbal subscore is 5. GCS motor subscore is 6.  Skin: Skin is warm and intact. No rash noted. She is diaphoretic. No cyanosis.  Psychiatric: She has a normal mood and affect. Her speech is normal and behavior is normal. Thought content normal.  Nursing note and vitals  reviewed.   ED Course  Procedures (including critical care time)  DIAGNOSTIC STUDIES: Oxygen Saturation is 94% on RA, adequate by my interpretation.    COORDINATION OF CARE: 12:48 AM- Will give Zofran and Dilaudid. Will order US abdomen limited RUQ, i-stat beta hCG blood, Lipase, CMP, CBC, and urinalysis. Discussed treatment plan with pt at bedside and pt agreed to plan.     Labs Review Labs Reviewed  COMPREHENSIVE METABOLIC PANEL - Abnormal; Notable for the following:    Glucose, Bld 163 (*)    Albumin 3.0 (*)    All other components within normal limits  CBC - Abnormal; Notable for the following:    RBC 3.36 (*)    Hemoglobin 10.9 (*)    HCT 32.6 (*)    RDW 15.6 (*)    All other components within normal limits  URINALYSIS, ROUTINE W REFLEX MICROSCOPIC (NOT AT [facility]) - Abnormal; Notable for the  following:    APPearance CLOUDY (*)    Hgb urine dipstick LARGE (*)    Leukocytes, UA SMALL (*)    All other components within normal limits  URINE MICROSCOPIC-ADD ON - Abnormal; Notable for the following:    Squamous Epithelial / LPF 0-5 (*)    Bacteria, UA FEW (*)    All other components within normal limits  I-STAT BETA HCG BLOOD, ED ([facility], [facility], [facility] ONLY) - Abnormal; Notable for the following:    I-stat hCG, quantitative 29.9 (*)    All other components within normal limits  LIPASE, BLOOD    Imaging Review Ct Renal Stone Study  06/06/2015  CLINICAL DATA:  Subacute onset of generalized abdominal pain, radiating to the back. Increased urinary frequency and productive cough. Initial encounter. EXAM: CT ABDOMEN AND PELVIS WITHOUT CONTRAST TECHNIQUE: Multidetector CT imaging of the abdomen and pelvis was performed following the standard protocol without IV contrast. COMPARISON:  Right upper quadrant ultrasound performed earlier today at 1:27 a.m. FINDINGS: Patchy bibasilar airspace opacities raise concern for pneumonia. The liver and spleen are unremarkable in appearance. The gallbladder is  within normal limits. The pancreas and adrenal glands are unremarkable. The kidneys are unremarkable in appearance. There is no evidence of hydronephrosis. No renal or ureteral stones are seen. No perinephric stranding is appreciated. No free fluid is identified. The small bowel is unremarkable in appearance. The stomach is within normal limits. No acute vascular abnormalities are seen. Mild postoperative change is noted about the umbilicus and left lower quadrant. The appendix is normal in caliber, without evidence of appendicitis. Mild diverticulosis is noted along the proximal sigmoid colon, without evidence of diverticulitis. The bladder is mildly distended and grossly unremarkable. There is an unusual 5.4 cm hypodensity at the level of the cervix. The ovaries are grossly symmetric. No suspicious adnexal masses are seen. No inguinal lymphadenopathy is seen. No acute osseous abnormalities are identified. Vacuum phenomenon is noted at L4-L5. IMPRESSION: 1. Patchy bibasilar airspace opacities raise concern for pneumonia. 2. 5.4 cm hypodensity at the level of the cervix appears mildly decreased in size since the prior pelvic ultrasound. Would correlate clinically as to whether this reflects a postoperative hematoma or residual soft tissue within the cervical canal. 3. Mild diverticulosis along the proximal sigmoid colon, without evidence of diverticulitis. Electronically Signed   By: Roanna Raider M.D.   On: 06/06/2015 04:55   US Abdomen Limited Ruq  06/06/2015  CLINICAL DATA:  Right upper quadrant pain for 2 weeks. Pelvic surgery on 05/14/2015. EXAM: US ABDOMEN LIMITED - RIGHT UPPER QUADRANT COMPARISON:  None. FINDINGS: Gallbladder: There is a 5 mm diameter polypoid lesion demonstrated on the nondependent gallbladder wall consistent with a gallbladder polyp. Based on size, this is likely to be benign. There is no gallbladder wall thickening. No gallstones. No sludge. Murphy's sign is negative. Common bile duct:  Diameter: 3 mm, normal Liver: No focal lesion identified. Within normal limits in parenchymal echogenicity. IMPRESSION: 5 mm gallbladder polyp, likely benign. No evidence of cholelithiasis or cholecystitis. Electronically Signed   By: Burman Nieves M.D.   On: 06/06/2015 01:57   I have personally reviewed and evaluated these images and lab results as part of my medical decision-making.   EKG Interpretation None      MDM   Final diagnoses:  CAP (community acquired pneumonia)  Pain of upper abdomen    Presents to the emergency department for evaluation of abdominal pain. Patient complaining of severe pain across her upper abdomen,  primarily right-sided. She reports that she has been having pain intermittently for 2 weeks. She has noticed some worsening with eating. Patient was in a great deal of discomfort at arrival. She had significant tenderness in the right upper quadrant. Gallbladder disease was suspected. Her workup, however, has been negative. This included ultrasound. Patient sent back for CT scan to rule out kidney stone or other acute abnormality. No intra-abdominal pathology is noted, but she does have evidence of pneumonia. She does report that she has had increased cough recently and will be treated with Levaquin. Based on her significant postprandial abdominal pain, however, will also refer to GI.  Patient recently had ectopic pregnancy surgery. She has been seen by OB/GYN at [facility] and it was not felt that the symptoms were related to that procedure. Her pain is not below the umbilicus.  I personally performed the services described in this documentation, which was scribed in my presence. The recorded information has been reviewed and is accurate.    Debbie Crease, MD 06/06/15 [phone]

## 2015-06-10 ENCOUNTER — Ambulatory Visit (INDEPENDENT_AMBULATORY_CARE_PROVIDER_SITE_OTHER): Payer: Self-pay | Admitting: Family Medicine

## 2015-06-10 VITALS — BP 120/80 | HR 82 | Temp 98.5°F | Resp 18 | Ht 62.0 in | Wt 195.2 lb

## 2015-06-10 DIAGNOSIS — J189 Pneumonia, unspecified organism: Secondary | ICD-10-CM

## 2015-06-10 DIAGNOSIS — R0602 Shortness of breath: Secondary | ICD-10-CM

## 2015-06-10 MED ORDER — ALBUTEROL SULFATE HFA 108 (90 BASE) MCG/ACT IN AERS: 2.0000 | INHALATION_SPRAY | Freq: Four times a day (QID) | RESPIRATORY_TRACT | Status: DC | PRN

## 2015-06-10 MED ORDER — LEVOFLOXACIN 500 MG PO TABS: 500.0000 mg | ORAL_TABLET | Freq: Every day | ORAL | Status: DC

## 2015-06-10 NOTE — Progress Notes (Signed)
Chief Complaint:  Chief Complaint  Patient presents with  . Hospitalization Follow-up    pneumonia  . Fatigue  . Shortness of Breath  . Other    right foot pain    HPI: Debbie Hebert is a 45 y.o. female who reports to [facility] today complaining of here for hospital fu for CAP, she  Was dc with LEvaquin, she still feels SOB.  She denies any fevers or chills, jsut has difficulty gettign a good breath when she coughs.   IMPRESSION: 1. Patchy bibasilar airspace opacities raise concern for pneumonia. 2. 5.4 cm hypodensity at the level of the cervix appears mildly decreased in size since the prior pelvic ultrasound. Would correlate clinically as to whether this reflects a postoperative hematoma or residual soft tissue within the cervical canal. 3. Mild diverticulosis along the proximal sigmoid colon, without evidence of diverticulitis.   Electronically Signed  By: Roanna Raider M.D.  On: 06/06/2015 04:55   Past Medical History  Diagnosis Date  . Hypertension   . Dysmenorrhea   . Diabetes mellitus without complication (HCC)   . Gestational diabetes    Past Surgical History  Procedure Laterality Date  . Cesarean section      X2  . Abdominal surgery      LIPOSUCTION  . Oophorectomy      RIGHT, WAS DONE IN [place]  . Cosmetic surgery     Social History   Social History  . Marital Status: Married    Spouse Name: N/A  . Number of Children: N/A  . Years of Education: N/A   Social History Main Topics  . Smoking status: Never Smoker   . Smokeless tobacco: Never Used  . Alcohol Use: No  . Drug Use: No  . Sexual Activity: Yes    Birth Control/ Protection: Condom   Other Topics Concern  . None   Social History Narrative   Family History  Problem Relation Age of Onset  . Diabetes Mother   . Hypertension Sister   . Hypertension Paternal Grandmother    No Known Allergies Prior to Admission medications   Medication Sig Start Date End Date Taking?  Authorizing Provider  acetaminophen (TYLENOL) 325 MG tablet Take 650 mg by mouth every 6 (six) hours as needed. Reported on 05/01/2015   Yes Historical Provider, MD  cyclobenzaprine (FLEXERIL) 5 MG tablet Take 1 tablet (5 mg total) by mouth 3 (three) times daily as needed for muscle spasms. 05/31/15  Yes Federico Flake, MD  dicyclomine (BENTYL) 20 MG tablet Take 1 tablet (20 mg total) by mouth 2 (two) times daily. 06/06/15  Yes [name], MD  famotidine (PEPCID) 20 MG tablet Take 1 tablet (20 mg total) by mouth 2 (two) times daily. 06/06/15  Yes [name], MD  ferrous sulfate 325 (65 FE) MG tablet Take 325 mg by mouth 2 (two) times daily with a meal.   Yes Historical Provider, MD  ibuprofen (ADVIL,MOTRIN) 600 MG tablet Take 1 tablet (600 mg total) by mouth every 6 (six) hours as needed. 05/31/15  Yes Federico Flake, MD  levofloxacin (LEVAQUIN) 750 MG tablet Take 1 tablet (750 mg total) by mouth daily. 06/06/15  Yes [name], MD  metFORMIN (GLUCOPHAGE) 500 MG tablet Take 1 tablet (500 mg total) by mouth 2 (two) times daily with a meal. Reported on 05/05/2015 05/05/15  Yes Ok Edwards, MD  metroNIDAZOLE (FLAGYL) 500 MG tablet Take 500 mg by mouth 3 (three)  times daily.   Yes Historical Provider, MD  oxyCODONE-acetaminophen (PERCOCET/ROXICET) 5-325 MG tablet Take 1-2 tablets by mouth every 6 (six) hours as needed for severe pain. 05/31/15  Yes Federico Flake, MD  HYDROcodone-acetaminophen (NORCO/VICODIN) 5-325 MG tablet Take 2 tablets by mouth every 4 (four) hours as needed for moderate pain. Patient not taking: Reported on 06/10/2015 06/06/15   [name], MD  methyldopa (ALDOMET) 250 MG tablet Take 1 tablet daily Patient not taking: Reported on 06/10/2015 05/05/15   Ok Edwards, MD     ROS: The patient denies fevers, chills, night sweats, unintentional weight loss, chest pain, palpitations, wheezing, dyspnea on exertion, nausea,  vomiting, abdominal pain, dysuria, hematuria, melena, numbness, weakness, or tingling.   All other systems have been reviewed and were otherwise negative with the exception of those mentioned in the HPI and as above.    PHYSICAL EXAM: Filed Vitals:   06/10/15 1743  BP: 120/80  Pulse: 82  Temp: 98.5 F (36.9 C)  Resp: 18   Body mass index is 35.69 kg/(m^2). SpO2 Readings from Last 3 Encounters:  06/10/15 96%  06/06/15 92%  05/31/15 98%     General: Alert, tired appearing HEENT:  Normocephalic, atraumatic, oropharynx patent. EOMI, PERRLA TM normal , no exudates Cardiovascular:  Regular rate and rhythm, no rubs murmurs or gallops.  No Carotid bruits, radial pulse intact. No pedal edema.  Respiratory: Clear to auscultation bilaterally.  No wheezes, rales, or rhonchi.  No cyanosis, no use of accessory musculature Abdominal: No organomegaly, abdomen is soft and non-tender, positive bowel sounds. No masses. Skin: No rashes. Neurologic: Facial musculature symmetric. Psychiatric: Patient acts appropriately throughout our interaction. Lymphatic: No cervical or submandibular lymphadenopathy Musculoskeletal: Gait intact. No edema, tenderness   LABS: Results for orders placed or performed during the hospital encounter of 06/06/15  Lipase, blood  Result Value Ref Range   Lipase 26 11 - 51 U/L  Comprehensive metabolic panel  Result Value Ref Range   Sodium 136 135 - 145 mmol/L   Potassium 4.1 3.5 - 5.1 mmol/L   Chloride 104 101 - 111 mmol/L   CO2 23 22 - 32 mmol/L   Glucose, Bld 163 (H) 65 - 99 mg/dL   BUN 10 6 - 20 mg/dL   Creatinine, Ser 1.61 0.44 - 1.00 mg/dL   Calcium 9.0 8.9 - 09.6 mg/dL   Total Protein 7.1 6.5 - 8.1 g/dL   Albumin 3.0 (L) 3.5 - 5.0 g/dL   AST 22 15 - 41 U/L   ALT 23 14 - 54 U/L   Alkaline Phosphatase 75 38 - 126 U/L   Total Bilirubin 0.4 0.3 - 1.2 mg/dL   GFR calc non Af Amer >60 >60 mL/min   GFR calc Af Amer >60 >60 mL/min   Anion gap 9 5 - 15  CBC    Result Value Ref Range   WBC 10.5 4.0 - 10.5 K/uL   RBC 3.36 (L) 3.87 - 5.11 MIL/uL   Hemoglobin 10.9 (L) 12.0 - 15.0 g/dL   HCT 04.5 (L) 40.9 - 81.1 %   MCV 97.0 78.0 - 100.0 fL   MCH 32.4 26.0 - 34.0 pg   MCHC 33.4 30.0 - 36.0 g/dL   RDW 91.4 (H) 78.2 - 95.6 %   Platelets 259 150 - 400 K/uL  Urinalysis, Routine w reflex microscopic (not at [facility])  Result Value Ref Range   Color, Urine YELLOW YELLOW   APPearance CLOUDY (A) CLEAR   Specific Gravity,  Urine 1.016 1.005 - 1.030   pH 5.0 5.0 - 8.0   Glucose, UA NEGATIVE NEGATIVE mg/dL   Hgb urine dipstick LARGE (A) NEGATIVE   Bilirubin Urine NEGATIVE NEGATIVE   Ketones, ur NEGATIVE NEGATIVE mg/dL   Protein, ur NEGATIVE NEGATIVE mg/dL   Nitrite NEGATIVE NEGATIVE   Leukocytes, UA SMALL (A) NEGATIVE  Urine microscopic-add on  Result Value Ref Range   Squamous Epithelial / LPF 0-5 (A) NONE SEEN   WBC, UA 6-30 0 - 5 WBC/hpf   RBC / HPF 0-5 0 - 5 RBC/hpf   Bacteria, UA FEW (A) NONE SEEN  I-Stat beta hCG blood, ED ([facility], [facility], [facility] only)  Result Value Ref Range   I-stat hCG, quantitative 29.9 (H) <5 mIU/mL   Comment 3             EKG/XRAY:   Primary read interpreted by Dr. Conley Rolls at [facility].   ASSESSMENT/PLAN: Encounter Diagnoses  Name Primary?  . CAP (community acquired pneumonia) Yes  . SOB (shortness of breath)    Was recently hospitalized for PNA , will give another 7 days of Levaquin She feels SOB sometimes with couhg, so will rx Albuterol inhaler.  Fu in 3-4 weeks for repeat chest xray  Abx precautions given    Gross sideeffects, risk and benefits, and alternatives of medications d/w patient. Patient is aware that all medications have potential sideeffects and we are unable to predict every sideeffect or drug-drug interaction that may occur.  Mills Mitton DO  06/10/2015 8:04 PM

## 2015-06-10 NOTE — Patient Instructions (Signed)
Neumonía extrahospitalaria en los adultos °(Community-Acquired Pneumonia, Adult) °La neumonía es una infección en los pulmones. Hay diferentes tipos de neumonía. Uno de ellos puede desarrollarse mientras una persona está en el hospital. Un tipo diferente, llamado neumonía extrahospitalaria, evoluciona en las personas que no están o no han estado recientemente en el hospital u otro centro de salud.  °CAUSAS °La neumonía puede ser bacteriana, viral o micótica. Con frecuencia, la causa de la neumonía extrahospitalaria es la bacteria Streptococcus pneumoniae. Estas bacterias suelen transmitirse de una persona a otra al inhalar las gotitas que un individuo infectado libera al toser o estornudar. °FACTORES DE RIESGO °Es más probable que la enfermedad se manifieste en: °· Las personas que padecen enfermedades crónicas, como enfermedad pulmonar obstructiva crónica (EPOC), asma, insuficiencia cardíaca congestiva, fibroso quística, diabetes o enfermedad renal. °· Las personas que tienen el VIH en etapa inicial o avanzada. °· Las personas que tienen anemia drepanocítica. °· Las personas a las que se les extrajo el bazo (esplenectomía). °· Las personas cuya higiene dental es deficiente. °· Las personas que sufren enfermedades que aumentan el riesgo de inspirar (aspirar) las secreciones de la propia boca y la nariz.   °· Las personas cuyo sistema inmunitario está debilitado (inmunodeprimido). °· Los fumadores. °· Las personas que viajan a regiones donde es frecuente la existencia de los gérmenes que causan la neumonía. °· Las personas que tienen contacto con hábitat de animales o con animales que son portadores de los gérmenes que causan neumonía, entre ellos, pájaros, murciélagos, conejos, gatos y animales de granja. °SÍNTOMAS °Los síntomas de esta enfermedad incluyen lo siguiente: °· Tos seca. °· Tos con expectoración (productiva). °· Fiebre. °· Sudoración. °· Dolor de pecho, especialmente al respirar profundamente o al  toser. °· Respiración rápida o dificultad para respirar. °· Falta de aire. °· Escalofríos. °· Fatiga. °· Dolores musculares. °DIAGNÓSTICO °El médico Anjoli Diemer hará una historia clínica y un examen físico. También pueden hacerle otros estudios, por ejemplo: °· Estudios de diagnóstico por imágenes, entre ellos, radiografías. °· Análisis para controlar el nivel de oxígeno y de otros gases en la sangre. °· Otros análisis de sangre, de la mucosidad (esputo), el líquido que rodea los pulmones (líquido pleural) y la orina. °Si la neumonía es grave, se pueden hacer otros estudios para identificar la causa específica de la enfermedad. °TRATAMIENTO °El tipo de tratamiento que se administra depende de muchos factores, por ejemplo, la causa de la neumonía, los medicamentos que toma y otras enfermedades que padezca. En el caso de la mayoría de los adultos, el tratamiento de la neumonía y la recuperación pueden hacerse en la casa. En algunos casos, el tratamiento debe administrarse en un hospital. El tratamiento puede incluir lo siguiente: °· Antibióticos, si la neumonía fue causada por bacterias. °· Antivirales, si la neumonía fue causada por un virus. °· Medicamentos que se administran por vía oral o por vía intravenosa (IV). °· Oxígeno. °· Terapia respiratoria. °Aunque es poco frecuente, el tratamiento de la neumonía grave puede incluir lo siguiente: °· Asistencia respiratoria mecánica. Este tratamiento se realiza si no respira bien por sí solo y no puede mantener un nivel de oxígeno adecuado. °· Toracocentesis. Este procedimiento se realiza para extraer el líquido que rodea uno o ambos pulmones, a fin de mejorar la respiración. °INSTRUCCIONES PARA EL CUIDADO EN EL HOGAR °· Tome los medicamentos de venta libre y los recetados solamente como se lo haya indicado el médico. °· Tome los medicamentos para la tos solamente si no puede dormir bien. Debe entender que   los medicamentos para la tos pueden minar la capacidad natural del  organismo de eliminar la mucosidad de los pulmones. °· Si Adian Jablonowski recetaron un antibiótico, tómelo como se lo haya indicado el médico. No deje de tomar los antibióticos aunque comience a sentirse mejor. °· De noche, duerma semisentado. Intente dormir en un sillón reclinable o póngase algunas almohadas debajo de la cabeza. °· No consuma productos que contengan tabaco, incluidos cigarrillos, tabaco de mascar y cigarrillos electrónicos. Si necesita ayuda para dejar de fumar, consulte al médico. °· Beba suficiente agua para mantener la orina clara o de color amarillo pálido. Esto ayudará a fluidificar las secreciones mucosas de los pulmones. °PREVENCIÓN °Existen formas de reducir el riesgo de tener neumonía extrahospitalaria. Considere la posibilidad de aplicarse la vacuna antineumocócica reúne estos requisitos: °· Es mayor de 65 años. °· Es mayor de 19 años y está recibiendo tratamiento oncológico, tiene enfermedad pulmonar crónica u otros trastornos que Aaden Buckman afectan el sistema inmunitario. Pregúntele al médico si esto es válido para su caso. °Hay diferentes tipos de vacunas antineumocócicas y cronogramas para su aplicación. Pregúntele al médico cuál es la opción de vacunación más adecuada para usted. °También puede evitar contraer neumonía extrahospitalaria si toma las siguientes medidas: °· Se aplica la vacuna antigripal todos los años. Pregúntele al médico cuál es el tipo de vacuna antigripal más adecuado para usted. °· Visita al dentista periódicamente. °· Se lava las manos con frecuencia. Utilice un desinfectante para manos si no dispone de agua y jabón. °SOLICITE ATENCIÓN MÉDICA SI: °· Tiene fiebre. °· No duerme bien porque no es posible controlar la tos con medicamentos para la tos. °SOLICITE ATENCIÓN MÉDICA DE INMEDIATO SI: °· Experimenta un empeoramiento en la falta de aire. °· El dolor de pecho es cada vez más intenso. °· La enfermedad empeora, especialmente si usted es un adulto mayor o su sistema inmunitario está  debilitado. °· Tose y escupe sangre. °  °Esta información no tiene como fin reemplazar el consejo del médico. Asegúrese de hacerle al médico cualquier pregunta que tenga. °  °Document Released: 02/02/2005 Document Revised: 01/14/2015 °Elsevier Interactive Patient Education ©2016 [employer] Inc[employer] ° ° °

## 2015-09-02 ENCOUNTER — Ambulatory Visit (INDEPENDENT_AMBULATORY_CARE_PROVIDER_SITE_OTHER): Payer: Self-pay | Admitting: Family Medicine

## 2015-09-02 VITALS — BP 122/80 | HR 72 | Temp 97.9°F | Resp 14 | Ht 62.0 in | Wt 204.0 lb

## 2015-09-02 DIAGNOSIS — F329 Major depressive disorder, single episode, unspecified: Secondary | ICD-10-CM

## 2015-09-02 DIAGNOSIS — F32A Depression, unspecified: Secondary | ICD-10-CM

## 2015-09-02 DIAGNOSIS — G44209 Tension-type headache, unspecified, not intractable: Secondary | ICD-10-CM

## 2015-09-02 DIAGNOSIS — R600 Localized edema: Secondary | ICD-10-CM

## 2015-09-02 MED ORDER — VENLAFAXINE HCL ER 75 MG PO CP24: 75.0000 mg | ORAL_CAPSULE | Freq: Every day | ORAL | Status: DC

## 2015-09-02 MED ORDER — HYDROCODONE-ACETAMINOPHEN 5-325 MG PO TABS: 2.0000 | ORAL_TABLET | ORAL | Status: DC | PRN

## 2015-09-02 MED ORDER — FUROSEMIDE 20 MG PO TABS: 20.0000 mg | ORAL_TABLET | Freq: Every day | ORAL | Status: DC

## 2015-09-02 NOTE — Patient Instructions (Addendum)
      IF you received an x-ray today, you will receive an invoice from [facility]. Please contact [facility] at [phone] with questions or concerns regarding your invoice.   IF you received labwork today, you will receive an invoice from [facility]. Please contact [facility] at [phone] with questions or concerns regarding your invoice.   Our billing staff will not be able to assist you with questions regarding bills from these companies.  You will be contacted with the lab results as soon as they are available. The fastest way to get your results is to activate your My Chart account. Instructions are located on the last page of this paperwork. If you have not heard from us regarding the results in 2 weeks, please contact this office.     [facility] Address: [address], [place], [place] [address]  [phone]  They have good support groups.

## 2015-09-02 NOTE — Progress Notes (Signed)
This is a 45 year old woman, married, Hispanic, who has suffered from depression the last six months and she lost her pregnancy. She's not had any negative talk to and spends much of the day by herself at home.  She complains of frontal headache for the last 4 days, eyes burning, mild nausea without vomiting, and weight gain with swollen ankles. Her graft in the past patient is taken hydrochlorthiazide but this ran out 6 months ago and has not been refilled. She's up that she may have hypertension still.  Patient has been diagnosed with diabetes in the past and is taking metformin. She is also taking fluoxetine but her depression remains. She is very concerned about her weight going up  Objective: Patient is able to smile and maintained good eye contact. She seen with her husband in the room HEENT: Unremarkable Neck: Supple no adenopathy or thyromegaly Chest: Clear Heart: Regular no murmur Extremities: 1+ pedal edema  Assessment: 45 year old depressed woman with multiple somatic complaints. She really needs counseling although she does not have any insurance.  Plan: Patient referred to hospice for community counseling Pedal edema - Plan: furosemide (LASIX) 20 MG tablet  Depression - Plan: venlafaxine XR (EFFEXOR XR) 75 MG 24 hr capsule  Tension-type headache, not intractable, unspecified chronicity pattern - Plan: HYDROcodone-acetaminophen (NORCO/VICODIN) 5-325 MG tablet  If patient is not better in 2 weeks when she returns, we can talk about short-term appetite suppressant.  Signed, Sheila OatsKurt Dodger Sinning M.D.

## 2015-09-21 ENCOUNTER — Encounter (HOSPITAL_COMMUNITY): Payer: Self-pay | Admitting: Emergency Medicine

## 2015-09-21 ENCOUNTER — Ambulatory Visit (INDEPENDENT_AMBULATORY_CARE_PROVIDER_SITE_OTHER): Payer: Self-pay

## 2015-09-21 ENCOUNTER — Ambulatory Visit (HOSPITAL_COMMUNITY)
Admission: EM | Admit: 2015-09-21 | Discharge: 2015-09-21 | Disposition: A | Discharge status: Home / Self Care | Payer: Self-pay | Attending: Family Medicine | Admitting: Family Medicine

## 2015-09-21 DIAGNOSIS — K589 Irritable bowel syndrome without diarrhea: Secondary | ICD-10-CM

## 2015-09-21 LAB — POCT URINALYSIS DIP (DEVICE)
Bilirubin Urine: NEGATIVE
Glucose, UA: NEGATIVE mg/dL
Hgb urine dipstick: NEGATIVE
Ketones, ur: NEGATIVE mg/dL
Leukocytes, UA: NEGATIVE
Nitrite: NEGATIVE
Protein, ur: NEGATIVE mg/dL
Specific Gravity, Urine: 1.02 (ref 1.005–1.030)
Urobilinogen, UA: 0.2 mg/dL (ref 0.0–1.0)
pH: 6 (ref 5.0–8.0)

## 2015-09-21 LAB — POCT PREGNANCY, URINE: Preg Test, Ur: NEGATIVE

## 2015-09-21 NOTE — ED Notes (Signed)
[facility] a [facility] of a headache, bloated abdomin, and pain in her right leg that starts at her hip x 1 week.

## 2015-09-21 NOTE — ED Provider Notes (Signed)
CSN: [id number]     Arrival date & time 09/21/15  1802 History   First MD Initiated Contact with Patient 09/21/15 1909     Chief Complaint  Patient presents with  . Headache  . Bloated  . Hip Pain   (Consider location/radiation/quality/duration/timing/severity/associated sxs/prior Treatment) Patient is a 45 y.o. female presenting with abdominal pain. The history is provided by the patient and the spouse.  Abdominal Pain Pain location:  Generalized Pain quality: bloating and fullness   Pain radiates to:  Does not radiate Pain severity:  Moderate Onset quality:  Gradual Duration:  1 week Progression:  Worsening Chronicity:  New Relieved by:  None tried Worsened by:  Nothing tried Ineffective treatments:  None tried Associated symptoms: no constipation, no diarrhea, no dysuria, no fever, no nausea and no vomiting     Past Medical History  Diagnosis Date  . Hypertension   . Dysmenorrhea   . Diabetes mellitus without complication (HCC)   . Gestational diabetes    Past Surgical History  Procedure Laterality Date  . Cesarean section      X2  . Abdominal surgery      LIPOSUCTION  . Oophorectomy      RIGHT, WAS DONE IN [place]  . Cosmetic surgery     Family History  Problem Relation Age of Onset  . Diabetes Mother   . Hypertension Sister   . Hypertension Paternal Grandmother    Social History  Substance Use Topics  . Smoking status: Never Smoker   . Smokeless tobacco: Never Used  . Alcohol Use: No   OB History    Gravida Para Term Preterm AB TAB SAB Ectopic Multiple Living   Review of Systems  Constitutional: Negative.  Negative for fever.  Gastrointestinal: Positive for abdominal pain. Negative for nausea, vomiting, diarrhea and constipation.  Genitourinary: Negative.  Negative for dysuria.  All other systems reviewed and are negative.   Allergies  Review of patient's allergies indicates no known allergies.  Home Medications   Prior to  Admission medications   Medication Sig Start Date End Date Taking? Authorizing Provider  furosemide (LASIX) 20 MG tablet Take 1 tablet (20 mg total) by mouth daily. 09/02/15  Yes Elvina Sidle, MD  HYDROcodone-acetaminophen (NORCO/VICODIN) 5-325 MG tablet Take 2 tablets by mouth every 4 (four) hours as needed for moderate pain. 09/02/15  Yes Elvina Sidle, MD  metFORMIN (GLUCOPHAGE) 500 MG tablet Take 1 tablet (500 mg total) by mouth 2 (two) times daily with a meal. Reported on 05/05/2015 05/05/15  Yes Ok Edwards, MD  venlafaxine XR (EFFEXOR XR) 75 MG 24 hr capsule Take 1 capsule (75 mg total) by mouth daily with breakfast. 09/02/15  Yes Elvina Sidle, MD  albuterol (PROVENTIL HFA;VENTOLIN HFA) 108 (90 Base) MCG/ACT inhaler Inhale 2 puffs into the lungs every 6 (six) hours as needed for wheezing or shortness of breath. Patient not taking: Reported on 09/02/2015 06/10/15   Thao P Le, DO   Meds Ordered and Administered this Visit  Medications - No data to display  BP 120/74 mmHg  Pulse 68  Temp(Src) 98.2 F (36.8 C) (Oral)  Resp 16  SpO2 100%  LMP 08/27/2015 No data found.   Physical Exam  Constitutional: She is oriented to person, place, and time. She appears well-developed and well-nourished. No distress.  Neck: Normal range of motion. Neck supple.  Cardiovascular: Normal rate, normal heart sounds and intact distal  pulses.   Pulmonary/Chest: Effort normal and breath sounds normal.  Abdominal: Soft. Bowel sounds are normal. She exhibits no mass. There is tenderness. There is no rebound and no guarding.  Lymphadenopathy:    She has no cervical adenopathy.  Neurological: She is alert and oriented to person, place, and time.  Skin: Skin is warm and dry.  Nursing note and vitals reviewed.   ED Course  Procedures (including critical care time)  Labs Review Labs Reviewed  POCT URINALYSIS DIP (DEVICE)  POCT PREGNANCY, URINE    Imaging Review Dg Abd 2 Views  09/21/2015   CLINICAL DATA:  Acute generalized abdominal pain. EXAM: ABDOMEN - 2 VIEW COMPARISON:  CT scan of June 06, 2015. FINDINGS: The bowel gas pattern is normal. There is no evidence of free air. No radio-opaque calculi or other significant radiographic abnormality is seen. IMPRESSION: No evidence bowel obstruction or ileus. Electronically Signed   By: Lupita RaiderJames  Green Jr, M.D.   On: 09/21/2015 20:07   X-rays reviewed and report per radiologist.   Visual Acuity Review  Right Eye Distance:   Left Eye Distance:   Bilateral Distance:    Right Eye Near:   Left Eye Near:    Bilateral Near:         MDM   1. Irritable bowel syndrome (IBS)        Linna HoffJames D Elmon Shader, MD 09/21/15 2022

## 2015-09-22 ENCOUNTER — Ambulatory Visit (INDEPENDENT_AMBULATORY_CARE_PROVIDER_SITE_OTHER): Payer: Self-pay | Admitting: Internal Medicine

## 2015-09-22 VITALS — BP 138/88 | HR 72 | Temp 98.2°F | Resp 16 | Ht 62.0 in | Wt 206.2 lb

## 2015-09-22 DIAGNOSIS — R112 Nausea with vomiting, unspecified: Secondary | ICD-10-CM

## 2015-09-22 DIAGNOSIS — R1084 Generalized abdominal pain: Secondary | ICD-10-CM

## 2015-09-22 DIAGNOSIS — R140 Abdominal distension (gaseous): Secondary | ICD-10-CM

## 2015-09-22 DIAGNOSIS — I10 Essential (primary) hypertension: Secondary | ICD-10-CM

## 2015-09-22 DIAGNOSIS — E119 Type 2 diabetes mellitus without complications: Secondary | ICD-10-CM

## 2015-09-22 DIAGNOSIS — R519 Headache, unspecified: Secondary | ICD-10-CM

## 2015-09-22 DIAGNOSIS — R51 Headache: Secondary | ICD-10-CM

## 2015-09-22 DIAGNOSIS — F330 Major depressive disorder, recurrent, mild: Secondary | ICD-10-CM

## 2015-09-22 LAB — POCT CBC
Granulocyte percent: 63.5 % (ref 37–80)
HCT, POC: 37.3 % — AB (ref 37.7–47.9)
Hemoglobin: 13.2 g/dL (ref 12.2–16.2)
Lymph, poc: 2.7 (ref 0.6–3.4)
MCH, POC: 31.8 pg — AB (ref 27–31.2)
MCHC: 35.4 g/dL (ref 31.8–35.4)
MCV: 89.9 fL (ref 80–97)
MID (cbc): 0.3 (ref 0–0.9)
MPV: 8.4 fL (ref 0–99.8)
POC Granulocyte: 5.1 (ref 2–6.9)
POC LYMPH PERCENT: 32.9 % (ref 10–50)
POC MID %: 3.6 % (ref 0–12)
Platelet Count, POC: 247 10*3/uL (ref 142–424)
RBC: 4.15 M/uL (ref 4.04–5.48)
RDW, POC: 12.9 %
WBC: 8.1 10*3/uL (ref 4.6–10.2)

## 2015-09-22 MED ORDER — ACETAMINOPHEN 325 MG PO TABS
1000.0000 mg | ORAL_TABLET | Freq: Once | ORAL | Status: AC
  Administered 2015-09-22: 975 mg via ORAL

## 2015-09-22 MED ORDER — POLYETHYLENE GLYCOL 3350 17 GM/SCOOP PO POWD: 17.0000 g | Freq: Every day | ORAL | Status: DC

## 2015-09-22 MED ORDER — ONDANSETRON HCL 4 MG PO TABS: 4.0000 mg | ORAL_TABLET | Freq: Three times a day (TID) | ORAL | Status: DC | PRN

## 2015-09-22 NOTE — Patient Instructions (Addendum)
     IF you received an x-ray today, you will receive an invoice from [facility]. Please contact [facility] at [phone] with questions or concerns regarding your invoice.   IF you received labwork today, you will receive an invoice from [facility]. Please contact [facility] at [phone] with questions or concerns regarding your invoice.   Our billing staff will not be able to assist you with questions regarding bills from these companies.  You will be contacted with the lab results as soon as they are available. The fastest way to get your results is to activate your My Chart account. Instructions are located on the last page of this paperwork. If you have not heard from us regarding the results in 2 weeks, please contact this office.      

## 2015-09-22 NOTE — Progress Notes (Signed)
By signing my name below, I, Mesha Guinyard, attest that this documentation has been prepared under the direction and in the presence of Ellamae Sia, MD.  Electronically Signed: Arvilla Market, Medical Scribe. 09/22/2015. 7:30 PM.  Subjective:    Patient ID: Debbie Hebert, female    DOB: May 05, 1971, 45 y.o.   MRN: [id number]  HPI Chief Complaint  Patient presents with  . Emesis    x this am x threw up x 6 times  . Headache  . Depression medication causing weight gain  . Positive Depression screening    HPI Comments: Debbie Hebert is a 45 y.o. female with a PHx of DM and an eptiopic pregnancy 05/14/15 who presents to the [facility] complaining of HA and emesis episode onset about a 2-3 weeks ago. Pt reports associated symptoms eye pain, constipation, nausea, bloating this morning. Pt reports abdominal pain and bloating occurs after she eats. No vision changes or dizziness. Pt reports going to [facility] yesterday for HA, and stomach aches. She had an abdominal x-ray, and urine analysis done that came back negative. No blood test while there. No treatment.   Pt hasn't checked her blood sugars. Very discouraged about inability to lose weight   Depression screen [facility] 2/9 09/22/2015 06/10/2015 05/01/2015 04/21/2015 12/12/2014  Decreased Interest 2 0 0 0 3  Down, Depressed, Hopeless 3 0 0 0 0  PHQ - 2 Score 5 0 0 0 3  Altered sleeping 3 - - - 3  Tired, decreased energy 3 - - - 1  Change in appetite 3 - - - 1  Feeling bad or failure about yourself  0 - - - 2  Trouble concentrating 0 - - - 1  Moving slowly or fidgety/restless 2 - - - 0  Suicidal thoughts 0 - - - 0  PHQ-9 Score 16 - - - 11  Difficult doing work/chores - - - - Somewhat difficult     Pt had an an ectopic pregnancy 05/14/15. She had with post-op uterine artery embolization at [facility]. Her depression is increased since this time. She was changed from Prozac did not work to Effexor by Dr.  Milus Glazier in April She thinks this has caused her to gain weight. She remains depressed. Specks menses any day now and says she is not pregnant.  Patient Active Problem List   Diagnosis Date Noted  . Type 2 diabetes mellitus (HCC) 05/31/2015  . Essential hypertension 05/05/2015  . Obesity 12/12/2014  . Weight gain 04/12/2012  . DUB (dysfunctional uterine bleeding) 04/12/2012     No Known Allergies Prior to Admission medications   Medication Sig Start Date End Date Taking? Authorizing Provider  albuterol (PROVENTIL HFA;VENTOLIN HFA) 108 (90 Base) MCG/ACT inhaler Inhale 2 puffs into the lungs every 6 (six) hours as needed for wheezing or shortness of breath. 06/10/15  Yes Thao P Le, DO  furosemide (LASIX) 20 MG tablet Take 1 tablet (20 mg total) by mouth daily. 09/02/15  Yes Elvina Sidle, MD  HYDROcodone-acetaminophen (NORCO/VICODIN) 5-325 MG tablet Take 2 tablets by mouth every 4 (four) hours as needed for moderate pain. 09/02/15  Yes Elvina Sidle, MD  metFORMIN (GLUCOPHAGE) 500 MG tablet Take 1 tablet (500 mg total) by mouth 2 (two) times daily with a meal. Reported on 05/05/2015 05/05/15  Yes Ok Edwards, MD  venlafaxine XR (EFFEXOR XR) 75 MG 24 hr capsule Take 1 capsule (75 mg total) by mouth daily with breakfast. 09/02/15  Yes [name], MD   Social History   Social History  . Marital Status: Married    Spouse Name: N/A  . Number of Children: N/A  . Years of Education: N/A   Occupational History  . Not on file.   Social History Main Topics  . Smoking status: Never Smoker   . Smokeless tobacco: Never Used  . Alcohol Use: No  . Drug Use: No  . Sexual Activity: Yes    Birth Control/ Protection: Condom   Other Topics Concern  . Not on file   Social History Narrative   Review of Systems  Constitutional: Positive for unexpected weight change. Negative for fever.  Eyes: Positive for pain.  Gastrointestinal: Positive for nausea, vomiting, constipation (a lot)  and abdominal distention. Negative for diarrhea.  Neurological: Positive for headaches.   Objective:  BP 138/88 mmHg  Pulse 72  Temp(Src) 98.2 F (36.8 C) (Oral)  Resp 16  Ht 5\' 2"  (1.575 m)  SpO2 98%  LMP 08/27/2015  Physical Exam  Constitutional: She appears well-developed and well-nourished. No distress.  HENT:  Head: Normocephalic and atraumatic.  Eyes: Conjunctivae are normal.  Neck: Neck supple.  Cardiovascular: Normal rate, regular rhythm, normal heart sounds and intact distal pulses.  Exam reveals no gallop and no friction rub.   No murmur heard. Pulmonary/Chest: Effort normal and breath sounds normal. No respiratory distress. She has no wheezes. She has no rales.  Abdominal: Bowel sounds are normal. There is tenderness in the right lower quadrant.  No organomegaly  Musculoskeletal:  No peripheral edema  Neurological: She is alert.  Skin: Skin is warm and dry.  Psychiatric: She has a normal mood and affect. Her behavior is normal.  Nursing note and vitals reviewed.   Results for orders placed or performed in visit on 09/22/15  POCT CBC  Result Value Ref Range   WBC 8.1 4.6 - 10.2 K/uL   Lymph, poc 2.7 0.6 - 3.4   POC LYMPH PERCENT 32.9 10 - 50 %L   MID (cbc) 0.3 0 - 0.9   POC MID % 3.6 0 - 12 %M   POC Granulocyte 5.1 2 - 6.9   Granulocyte percent 63.5 37 - 80 %G   RBC 4.15 4.04 - 5.48 M/uL   Hemoglobin 13.2 12.2 - 16.2 g/dL   HCT, POC 40.937.3 (A) 81.137.7 - 47.9 %   MCV 89.9 80 - 97 fL   MCH, POC 31.8 (A) 27 - 31.2 pg   MCHC 35.4 31.8 - 35.4 g/dL   RDW, POC 91.412.9 %   Platelet Count, POC 247 142 - 424 K/uL   MPV 8.4 0 - 99.8 fL   Assessment & Plan:  Nausea and vomiting, intractability of vomiting not specified, unspecified vomiting type - Plan: POCT CBC, Comprehensive metabolic panel  Abdominal pain, generalized Abdominal bloating -Mild review of the abdominal x-ray done yesterday next me believe she has her symptoms from significant constipation as there is  bowel distention all along the ascending colon with trapped air and stool all throughout the transverse and distal colon as well  Essential hypertension-By history without current therapy.  Type 2 diabetes mellitus without complication, without long-term current use of insulin (HCC) - Plan: Hemoglobin A1 Lab Results  Component Value Date   HGBA1C 6.7* 09/22/2015  No change in metformin dose  Nonintractable headache, unspecified chronicity pattern, unspecified headache type - Plan: acetaminophen (TYLENOL) tablet 975 mg  Reactive depression--plans to follow-up with Dr.L to adjust Effexor  Meds  ordered this encounter  Medications  . FLUoxetine (PROZAC) 20 MG tablet    Sig: TAKE 1 TABLET (20 MG TOTAL) BY MOUTH DAILY. REPEAT TO 2 TABLETS AFTER 2 WEEKS    Refill:  3  . acetaminophen (TYLENOL) tablet 975 mg    Sig:   . polyethylene glycol powder (GLYCOLAX/MIRALAX) powder    Sig: Take 17 g by mouth daily.    Dispense:  250 g    Refill:  1  . ondansetron (ZOFRAN) 4 MG tablet    Sig: Take 1 tablet (4 mg total) by mouth every 8 (eight) hours as needed for nausea or vomiting.    Dispense:  20 tablet    Refill:  0     I have completed the patient encounter in its entirety as documented by the scribe, with editing by me where necessary. Bettyanne Dittman P. Merla Riches, M.D.  Addendum labs Results for orders placed or performed in visit on 09/22/15  Comprehensive metabolic panel  Result Value Ref Range   Sodium 139 135 - 146 mmol/L   Potassium 4.1 3.5 - 5.3 mmol/L   Chloride 103 98 - 110 mmol/L   CO2 21 20 - 31 mmol/L   Glucose, Bld 125 (H) 65 - 99 mg/dL   BUN 13 7 - 25 mg/dL   Creat 1.61 0.96 - 0.45 mg/dL   Total Bilirubin 0.3 0.2 - 1.2 mg/dL   Alkaline Phosphatase 58 33 - 115 U/L   AST 21 10 - 30 U/L   ALT 43 (H) 6 - 29 U/L   Total Protein 7.0 6.1 - 8.1 g/dL   Albumin 4.0 3.6 - 5.1 g/dL   Calcium 9.0 8.6 - 40.9 mg/dL   Abdominal ultrasound is indicated to rule out gallbladder disease

## 2015-09-23 LAB — COMPREHENSIVE METABOLIC PANEL
ALT: 43 U/L — ABNORMAL HIGH (ref 6–29)
AST: 21 U/L (ref 10–30)
Albumin: 4 g/dL (ref 3.6–5.1)
Alkaline Phosphatase: 58 U/L (ref 33–115)
BUN: 13 mg/dL (ref 7–25)
CO2: 21 mmol/L (ref 20–31)
Calcium: 9 mg/dL (ref 8.6–10.2)
Chloride: 103 mmol/L (ref 98–110)
Creat: 0.79 mg/dL (ref 0.50–1.10)
Glucose, Bld: 125 mg/dL — ABNORMAL HIGH (ref 65–99)
Potassium: 4.1 mmol/L (ref 3.5–5.3)
Sodium: 139 mmol/L (ref 135–146)
Total Bilirubin: 0.3 mg/dL (ref 0.2–1.2)
Total Protein: 7 g/dL (ref 6.1–8.1)

## 2015-09-24 LAB — HEMOGLOBIN A1C
Hgb A1c MFr Bld: 6.7 % — ABNORMAL HIGH (ref <5.7)
Mean Plasma Glucose: 146 mg/dL

## 2015-09-25 ENCOUNTER — Ambulatory Visit (INDEPENDENT_AMBULATORY_CARE_PROVIDER_SITE_OTHER): Payer: Self-pay | Admitting: Family Medicine

## 2015-09-25 VITALS — BP 138/90 | HR 77 | Temp 97.9°F | Resp 14 | Ht 62.0 in | Wt 205.6 lb

## 2015-09-25 DIAGNOSIS — E669 Obesity, unspecified: Secondary | ICD-10-CM

## 2015-09-25 MED ORDER — PHENTERMINE HCL 15 MG PO CAPS
15.0000 mg | ORAL_CAPSULE | ORAL | Status: DC
Start: 2015-09-25 — End: 2015-10-21

## 2015-09-25 NOTE — Progress Notes (Signed)
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  Doing better on the Prozac.    Objective:  BP 138/90 mmHg  Pulse 77  Temp(Src) 97.9 F (36.6 C) (Oral)  Resp 14  Ht 5\' 2"  (1.575 m)  Wt 205 lb 9.6 oz (93.26 kg)  BMI 37.60 kg/m2  SpO2 99%  LMP 08/27/2015 Wt Readings from Last 3 Encounters:  09/25/15 205 lb 9.6 oz (93.26 kg)  09/22/15 206 lb 3.2 oz (93.532 kg)  09/02/15 204 lb (92.534 kg)   Results for orders placed or performed in visit on 09/22/15  Comprehensive metabolic panel  Result Value Ref Range   Sodium 139 135 - 146 mmol/L   Potassium 4.1 3.5 - 5.3 mmol/L   Chloride 103 98 - 110 mmol/L   CO2 21 20 - 31 mmol/L   Glucose, Bld 125 (H) 65 - 99 mg/dL   BUN 13 7 - 25 mg/dL   Creat 4.090.79 8.110.50 - 9.141.10 mg/dL   Total Bilirubin 0.3 0.2 - 1.2 mg/dL   Alkaline Phosphatase 58 33 - 115 U/L   AST 21 10 - 30 U/L   ALT 43 (H) 6 - 29 U/L   Total Protein 7.0 6.1 - 8.1 g/dL   Albumin 4.0 3.6 - 5.1 g/dL   Calcium 9.0 8.6 - 78.210.2 mg/dL  Hemoglobin N5AA1c  Result Value Ref Range   Hgb A1c MFr Bld 6.7 (H) <5.7 %   Mean Plasma Glucose 146 mg/dL  POCT CBC  Result Value Ref Range   WBC 8.1 4.6 - 10.2 K/uL   Lymph, poc 2.7 0.6 - 3.4   POC LYMPH PERCENT 32.9 10 - 50 %L   MID (cbc) 0.3 0 - 0.9   POC MID % 3.6 0 - 12 %M   POC Granulocyte 5.1 2 - 6.9   Granulocyte percent 63.5 37 - 80 %G   RBC 4.15 4.04 - 5.48 M/uL   Hemoglobin 13.2 12.2 - 16.2 g/dL   HCT, POC 21.337.3 (A) 08.637.7 - 47.9 %   MCV 89.9 80 - 97 fL   MCH, POC 31.8 (A) 27 - 31.2 pg   MCHC 35.4 31.8 - 35.4 g/dL   RDW, POC 57.812.9 %   Platelet Count, POC 247 142 - 424 K/uL   MPV 8.4 0 - 99.8 fL   Obesity - Plan: phentermine 15 MG capsule  Elvina SidleKurt Leron Stoffers, MD

## 2015-09-25 NOTE — Patient Instructions (Signed)
Junio 14 despues de las 2 [employer] McLennanhasta las seis de la tarde o Junio 15 de las 10 [employer]asta las seis.

## 2015-10-03 ENCOUNTER — Other Ambulatory Visit: Payer: Self-pay | Admitting: *Deleted

## 2015-10-03 DIAGNOSIS — R1011 Right upper quadrant pain: Secondary | ICD-10-CM

## 2015-10-03 DIAGNOSIS — R945 Abnormal results of liver function studies: Secondary | ICD-10-CM

## 2015-10-03 DIAGNOSIS — R7989 Other specified abnormal findings of blood chemistry: Secondary | ICD-10-CM

## 2015-10-21 ENCOUNTER — Ambulatory Visit (INDEPENDENT_AMBULATORY_CARE_PROVIDER_SITE_OTHER): Payer: Self-pay | Admitting: Family Medicine

## 2015-10-21 VITALS — BP 122/82 | HR 76 | Temp 97.9°F | Resp 18 | Wt 207.0 lb

## 2015-10-21 DIAGNOSIS — E669 Obesity, unspecified: Secondary | ICD-10-CM

## 2015-10-21 DIAGNOSIS — G44209 Tension-type headache, unspecified, not intractable: Secondary | ICD-10-CM

## 2015-10-21 MED ORDER — PHENTERMINE HCL 37.5 MG PO CAPS: 37.5000 mg | ORAL_CAPSULE | ORAL | Status: DC

## 2015-10-21 MED ORDER — ACETAMINOPHEN 325 MG PO TABS
500.0000 mg | ORAL_TABLET | Freq: Once | ORAL | Status: AC
  Administered 2015-10-21: 487.5 mg via ORAL

## 2015-10-21 MED ORDER — ACETAMINOPHEN 325 MG PO TABS: 500.0000 mg | ORAL_TABLET | Freq: Four times a day (QID) | ORAL | Status: DC | PRN

## 2015-10-21 MED ORDER — FLUOXETINE HCL 20 MG PO TABS: 20.0000 mg | ORAL_TABLET | Freq: Every day | ORAL | Status: DC

## 2015-10-21 NOTE — Patient Instructions (Signed)
     IF you received an x-ray today, you will receive an invoice from [facility]. Please contact [facility] at [phone] with questions or concerns regarding your invoice.   IF you received labwork today, you will receive an invoice from [facility]. Please contact [facility] at [phone] with questions or concerns regarding your invoice.   Our billing staff will not be able to assist you with questions regarding bills from these companies.  You will be contacted with the lab results as soon as they are available. The fastest way to get your results is to activate your My Chart account. Instructions are located on the last page of this paperwork. If you have not heard from us regarding the results in 2 weeks, please contact this office.      

## 2015-10-21 NOTE — Progress Notes (Signed)
  45 yo woman who is here for followup of her obesity and mood disorder.  She states that the phentermine was not effective.  She has taken this at a higher dose with better success in the past.  The fluoxetine worked well for her mood disorder     Objective:  BP 122/82 mmHg  Pulse 76  Temp(Src) 97.9 F (36.6 C) (Oral)  Resp 18  Wt 207 lb (93.895 kg)  SpO2 98%  LMP 09/20/2015 Wt Readings from Last 3 Encounters:  10/21/15 207 lb (93.895 kg)  09/25/15 205 lb 9.6 oz (93.26 kg)  09/22/15 206 lb 3.2 oz (93.532 kg)   Discussed alternatives to weight loss with patient and daughter.  Face to face 20 minutes requiring some translation.     ICD-9-CM ICD-10-CM   1. Obesity 278.00 E66.9 phentermine 37.5 MG capsule     DISCONTINUED: phentermine 37.5 MG capsule  2. Tension-type headache, not intractable, unspecified chronicity pattern 339.10 G44.209 acetaminophen (TYLENOL) tablet 487.5 mg     acetaminophen (TYLENOL) tablet 487.5 mg   Recheck in 2 months  Signed, Elvina SidleKurt Shatira Dobosz, MD

## 2015-11-12 ENCOUNTER — Telehealth: Payer: Self-pay

## 2015-11-12 ENCOUNTER — Other Ambulatory Visit: Payer: Self-pay | Admitting: Family Medicine

## 2015-11-12 NOTE — Telephone Encounter (Signed)
Pt's daughter called in stating that her mom is leaving to go out of the country for one month this coming Saturday. She would like to get her phentermine 37.5 MG capsule [[id number]][[id number]] refilled before 7/15 because of her upcoming trip. Please advise at [phone]

## 2015-11-13 NOTE — Telephone Encounter (Signed)
Can we give pharm OK to fill early?

## 2015-11-13 NOTE — Telephone Encounter (Signed)
I would prefer patient wait for refill until after she gets back..  She needs to be off the medicine now because it is time to see what she can do on her own

## 2015-11-13 NOTE — Telephone Encounter (Signed)
I will resend this to PA pool since pt is leaving town tomorrow and no one has seen it in Dr OmnicomL's box yet.

## 2015-11-13 NOTE — Telephone Encounter (Signed)
Please note Dr Tommi RumpsLauensteins note below

## 2015-11-14 NOTE — Telephone Encounter (Signed)
Patient called in, I gave them Dr. Cain SaupeL's message from below and they seemed to understand.

## 2015-12-04 ENCOUNTER — Encounter: Payer: Self-pay | Admitting: Gynecology

## 2015-12-04 ENCOUNTER — Ambulatory Visit (INDEPENDENT_AMBULATORY_CARE_PROVIDER_SITE_OTHER): Payer: Self-pay | Admitting: Gynecology

## 2015-12-04 VITALS — BP 128/82

## 2015-12-04 DIAGNOSIS — R635 Abnormal weight gain: Secondary | ICD-10-CM

## 2015-12-04 DIAGNOSIS — N912 Amenorrhea, unspecified: Secondary | ICD-10-CM

## 2015-12-04 LAB — TSH: TSH: 1.09 m[IU]/L

## 2015-12-04 LAB — PREGNANCY, URINE: Preg Test, Ur: NEGATIVE

## 2015-12-04 MED ORDER — MEDROXYPROGESTERONE ACETATE 10 MG PO TABS: 10.0000 mg | ORAL_TABLET | Freq: Every day | ORAL | 4 refills | Status: DC

## 2015-12-04 NOTE — Patient Instructions (Signed)
Amenorrea secundaria  (Secondary Amenorrhea) La amenorrea secundaria es la detencin del flujo menstrual durante 3-6 meses en una mujer que previamente tena sus perodos. Hay muchas causas posibles: La mayora de las causas no son graves. Generalmente, al tratar el problema subyacente que causa la detencin de la menstruacin, podr volver a tener sus perodos normales. CAUSAS  Algunas causas comunes de la falta de menstruacin son:  Desnutricin.  Bajo nivel de azcar en la sangre (hipoglucemia).  Enfermedad poliqustica de los ovarios.  Estrs o miedos.  Lactancia materna.  Desequilibrio hormonal.  Insuficiencia ovrica.  Medicamentos.  Obesidad extrema.  Fibrosis qustica.  Reduccin de peso drstica por cualquier causa.  Menopausia precoz.  Extirpacin de los ovarios o del tero.  Anticonceptivos.  Enfermedades.  Enfermedades de larga duracin (crnicas).  Sndrome de [place].  Problemas de tiroides.  Pldoras, parches o anillos vaginales para el control de la natalidad. FACTORES DE RIESGO Puede tener ms riesgo de amenorrea secundaria si:  Tiene una historia familiar de este problema.  Sufre un trastorno alimentario.  Realiza entrenamiento deportivo. DIAGNSTICO  Este diagnstico la realiza el mdico por medio de la historia clnica y el examen fsico. Incluir un examen plvico para verificar si hay problemas en los rganos reproductores. Debe descartarse la posibilidad de embarazo. Generalmente se indicarn diferentes anlisis de sangre para medir diferentes tipos de hormonas en el organismo. Le indicarn anlisis de orina. Le harn algunos estudios especializados (ecografas, tomografa computada, resonancia magntica o histeroscopa) y tambin medirn su ndice de masa corporal (IMC). TRATAMIENTO  El tratamiento depende de la causa de la amenorrea. Si hay un trastorno de la alimentacin, deber tratarse con la dieta y la terapia adecuadas. Los  trastornos crnicos pueden mejorar con el tratamiento de la enfermedad. La amenorrea puede corregirse con medicamentos, cambios en el estilo de vida o con ciruga. Si la amenorrea no puede corregirse, algunas veces es posible crear una falsa menstruacin con medicamentos. INSTRUCCIONES PARA EL CUIDADO EN EL HOGAR  Consuma una dieta saludable.  Controle los problemas de peso.  Haga ejercicios con regularidad, pero no excesivamente.  Duerma lo suficiente.  Controle el estrs.  Observe si hay cambios en el ciclo menstrual. Mantenga un registro del momento en que ocurren los perodos. Anote la fecha de inicio de los perodos, cunto duran y si hay problemas. SOLICITE ATENCIN MDICA SI: Los sntomas no mejoran con el tratamiento.   Esta informacin no tiene como fin reemplazar el consejo del mdico. Asegrese de hacerle al mdico cualquier pregunta que tenga.   Document Released: 12/26/2012 Document Revised: 05/16/2014 Elsevier Interactive Patient Education 2016 Elsevier Inc.  

## 2015-12-04 NOTE — Progress Notes (Signed)
   HPI: Patient is a 45 year old gravida 3 para 2 AB 1 who was seen in the office in December 2016 and was found to be pregnant. Patient was a high risk for pregnancy as a result of her hypertension diabetes and was referred [facility] whereby Dr. April Manson is a result of patient's C-section scar ectopic had proceeded with KCl injection into the fetal thorax as well as systemic methotrexate injection. Patient continued to bleed excessively and she was taken to the operating room where laparoscopic she underwent bilateral uterine artery embolization as well as D&C. Patient is here in the office today because she has not had a menstrual cycle since May. She denies any nausea, vomiting, breast tenderness or any nipple discharge occasional headaches. She does feel bloated at times.   ROS: A ROS was performed and pertinent positives and negatives are included in the history.  GENERAL: No fevers or chills. HEENT: No change in vision, no earache, sore throat or sinus congestion. NECK: No pain or stiffness. CARDIOVASCULAR: No chest pain or pressure. No palpitations. PULMONARY: No shortness of breath, cough or wheeze. GASTROINTESTINAL: No abdominal pain, nausea, vomiting or diarrhea, melena or bright red blood per rectum. GENITOURINARY: No urinary frequency, urgency, hesitancy or dysuria. MUSCULOSKELETAL: No joint or muscle pain, no back pain, no recent trauma. DERMATOLOGIC: No rash, no itching, no lesions. ENDOCRINE: No polyuria, polydipsia, no heat or cold intolerance. No recent change in weight. HEMATOLOGICAL: No anemia or easy bruising or bleeding. NEUROLOGIC: No headache, seizures, numbness, tingling or weakness. PSYCHIATRIC: No depression, no loss of interest in normal activity or change in sleep pattern.   PE: Blood pressure 128/82 Gen. 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 Urine presented test was negative. We'll obtain a TSH to rule out thyroid dysfunction as well as a prolactin level to rule out hyperprolactinemia as possible etiology for her anovulation. At both [facility] tests are negative I have given her prescription for Provera to take 10 mg 1 by mouth daily for 10 days of the month. She has sufficient refills to continue to do this every 30 days if she does not have a spontaneous menses. A cousin of her being high-risk for any future pregnancies her husband is going to see the urologist for a vasectomy and meanwhile she will use condoms for contraception.    Greater than 50% of time was spent in counseling and coordinating care of this patient.   Time of consultation: 15   Minutes.

## 2015-12-05 LAB — PROLACTIN: Prolactin: 7.8 ng/mL

## 2015-12-07 ENCOUNTER — Telehealth: Payer: Self-pay | Admitting: *Deleted

## 2015-12-07 NOTE — Telephone Encounter (Signed)
Pt called requesting TSH and prolactin level results from OV on 12/04/15. Results appear normal okay for pt to proceed with provera 10 mg daily for 10 days? Please advise

## 2015-12-07 NOTE — Telephone Encounter (Signed)
Claudia will inform pt. 

## 2015-12-07 NOTE — Telephone Encounter (Signed)
Yes she can start her Provera. TSH and Prl were normal

## 2015-12-16 ENCOUNTER — Other Ambulatory Visit: Payer: Self-pay

## 2015-12-16 MED ORDER — FLUOXETINE HCL 20 MG PO TABS: 20.0000 mg | ORAL_TABLET | Freq: Every day | ORAL | 0 refills | Status: DC

## 2016-02-18 ENCOUNTER — Ambulatory Visit: Payer: No Typology Code available for payment source | Admitting: Dietician

## 2016-02-23 ENCOUNTER — Ambulatory Visit: Payer: No Typology Code available for payment source | Admitting: Dietician

## 2016-02-25 ENCOUNTER — Ambulatory Visit (INDEPENDENT_AMBULATORY_CARE_PROVIDER_SITE_OTHER): Payer: Self-pay | Admitting: Family Medicine

## 2016-02-25 ENCOUNTER — Encounter: Payer: Self-pay | Admitting: Family Medicine

## 2016-02-25 VITALS — BP 132/82 | HR 82 | Temp 98.2°F | Resp 17 | Ht 62.0 in | Wt 205.0 lb

## 2016-02-25 DIAGNOSIS — F321 Major depressive disorder, single episode, moderate: Secondary | ICD-10-CM

## 2016-02-25 DIAGNOSIS — R1032 Left lower quadrant pain: Secondary | ICD-10-CM

## 2016-02-25 MED ORDER — BUPROPION HCL 75 MG PO TABS: 75.0000 mg | ORAL_TABLET | Freq: Two times a day (BID) | ORAL | 1 refills | Status: DC

## 2016-02-25 NOTE — Patient Instructions (Addendum)
It was very good to meet you today.    Take the Wellbutrin one pill a day for 3 days.  On Sunday, starting taking it twice a day.      IF you received an x-ray today, you will receive an invoice from [facility]. Please contact [facility] at [phone] with questions or concerns regarding your invoice.   IF you received labwork today, you will receive an invoice from [facility]. Please contact [facility] at [phone] with questions or concerns regarding your invoice.   Our billing staff will not be able to assist you with questions regarding bills from these companies.  You will be contacted with the lab results as soon as they are available. The fastest way to get your results is to activate your My Chart account. Instructions are located on the last page of this paperwork. If you have not heard from us regarding the results in 2 weeks, please contact this office.

## 2016-02-25 NOTE — Progress Notes (Signed)
Debbie Hebert is a 45 y.o. female who presents to [facility] today for depression:  1.  Depression:  Patient has history of this in the past. However her daughter who is present today at the visit states that she had ectopic pregnancy surgery in January of this year. She has not that she had normal intrauterine pregnancy and baby was about 3 months pregnant and the ectopic was found.  Removed surgically. Since then daughter and patient both corroborate that patient spends most the time laying on the couch. Daughter states that her mom wakes up late and then goes to the couch and was on the bed. Watches TV or sleeps on the couch. She then gets up and goes back to her bed for the night. She was previously on Effexor but states she was worried this is causing her to gain weight and therefore she stopped this abruptly one month ago. Patient states that her depressive symptoms increased fairly noticeably within past 3-4 weeks.  No suicidal or homicidal ideation.  No history of this either. She is interested in working to lose weight. However she states she has no energy.  She also mentioned LLQ worse during menses for past several months.  Describes as aching pain.   ROS as above.  Pertinently, no chest pain, palpitations, SOB, Fever, Chills, Abd pain, N/V/D.   PMH reviewed. Patient is a nonsmoker.   Past Medical History:  Diagnosis Date  . Diabetes mellitus without complication (HCC)   . Dysmenorrhea   . Gestational diabetes   . Hypertension    Past Surgical History:  Procedure Laterality Date  . ABDOMINAL SURGERY     LIPOSUCTION  . CESAREAN SECTION     X2  . COSMETIC SURGERY    . DILATION AND CURETTAGE OF UTERUS    . OOPHORECTOMY     RIGHT, WAS DONE IN [place]    Medications reviewed. Current Outpatient Prescriptions  Medication Sig Dispense Refill  . cyanocobalamin 1000 MCG tablet Take 1,000 mcg by mouth daily.    Marland Kitchen FLUoxetine (PROZAC) 20 MG tablet Take 1  tablet (20 mg total) by mouth daily. 90 tablet 0  . furosemide (LASIX) 20 MG tablet Take 1 tablet (20 mg total) by mouth daily. 30 tablet 3  . medroxyPROGESTERone (PROVERA) 10 MG tablet Take 1 tablet (10 mg total) by mouth daily. 30 tablet 4  . metFORMIN (GLUCOPHAGE) 500 MG tablet Take 1 tablet (500 mg total) by mouth 2 (two) times daily with a meal. Reported on 05/05/2015 60 tablet 5  . HYDROcodone-acetaminophen (NORCO/VICODIN) 5-325 MG tablet Take 2 tablets by mouth every 4 (four) hours as needed for moderate pain. (Patient not taking: Reported on 02/25/2016) 10 tablet 0  . phentermine 37.5 MG capsule Take 1 capsule (37.5 mg total) by mouth every morning. Refill after November 21, 2015 (Patient not taking: Reported on 02/25/2016) 30 capsule 0  . polyethylene glycol powder (GLYCOLAX/MIRALAX) powder Take 17 g by mouth daily. (Patient not taking: Reported on 02/25/2016) 250 g 1  . venlafaxine (EFFEXOR) 50 MG tablet Take 50 mg by mouth 2 (two) times daily.     Current Facility-Administered Medications  Medication Dose Route Frequency Provider Last Rate Last Dose  . acetaminophen (TYLENOL) tablet 487.5 mg  487.5 mg Oral Q6H PRN Elvina Sidle, MD         Physical Exam:  BP 132/82 (BP Location: Right Arm, Patient Position: Sitting, Cuff Size: Large)   Pulse 82   Temp  98.2 F (36.8 C) (Oral)   Resp 17   Ht 5\' 2"  (1.575 m)   Wt 205 lb (93 kg)   LMP 01/27/2016 (Approximate)   SpO2 98%   BMI 37.49 kg/m  Gen:  Alert, cooperative patient who appears stated age in no acute distress.  Vital signs reviewed. HEENT: EOMI,  MMM Pulm:  Clear to auscultation b Cardiac:  Regular rate and rhythm  Abd:  Soft/nondistended/nontender.   Exts: Non edematous BL  LE, warm and well perfused.  Psych: Somewhat flat affect. She did exhibit a little bit more affect is the encounter went on. Did appear depressed.  Assessment and Plan:  1.  Depression: - long discussion with patient about this.  Entire visit lasted  about 30 minutes face time.  - She is very concerned about starting a depressed medication because she is afraid she will gain weight. -After much discussion of this she agreed to trial of bupropion. -She is to follow-up in about a month to see how she's doing. -Also discussed various weight management options. This is a separate issue and we will discuss this further next visit.  2.  LLQ pain:  - worse during menses - possibility of ovarian cyst.  However, CT scan revealed echogenic mass, which was most likely the ectopic pregnancy.  May need repeat pelvic U/S.

## 2016-03-03 ENCOUNTER — Other Ambulatory Visit: Payer: Self-pay

## 2016-03-03 DIAGNOSIS — R600 Localized edema: Secondary | ICD-10-CM

## 2016-03-03 MED ORDER — FUROSEMIDE 20 MG PO TABS: 20.0000 mg | ORAL_TABLET | Freq: Every day | ORAL | 0 refills | Status: DC

## 2016-03-08 ENCOUNTER — Ambulatory Visit (INDEPENDENT_AMBULATORY_CARE_PROVIDER_SITE_OTHER): Payer: Self-pay | Admitting: Family Medicine

## 2016-03-08 ENCOUNTER — Encounter: Payer: Self-pay | Admitting: Family Medicine

## 2016-03-08 VITALS — BP 127/95 | HR 75 | Temp 98.2°F | Ht 62.0 in | Wt 207.8 lb

## 2016-03-08 DIAGNOSIS — N83209 Unspecified ovarian cyst, unspecified side: Secondary | ICD-10-CM

## 2016-03-08 DIAGNOSIS — I10 Essential (primary) hypertension: Secondary | ICD-10-CM

## 2016-03-08 DIAGNOSIS — F329 Major depressive disorder, single episode, unspecified: Secondary | ICD-10-CM

## 2016-03-08 DIAGNOSIS — R3589 Other polyuria: Secondary | ICD-10-CM

## 2016-03-08 DIAGNOSIS — R062 Wheezing: Secondary | ICD-10-CM

## 2016-03-08 DIAGNOSIS — R358 Other polyuria: Secondary | ICD-10-CM

## 2016-03-08 DIAGNOSIS — J45909 Unspecified asthma, uncomplicated: Secondary | ICD-10-CM

## 2016-03-08 LAB — CBC WITH DIFFERENTIAL/PLATELET
Basophils Absolute: 87 {cells}/uL (ref 0–200)
Basophils Relative: 1 %
Eosinophils Absolute: 87 {cells}/uL (ref 15–500)
Eosinophils Relative: 1 %
HCT: 40 % (ref 35.0–45.0)
Hemoglobin: 13.6 g/dL (ref 11.7–15.5)
Lymphocytes Relative: 51 %
Lymphs Abs: 4437 {cells}/uL — ABNORMAL HIGH (ref 850–3900)
MCH: 31.6 pg (ref 27.0–33.0)
MCHC: 34 g/dL (ref 32.0–36.0)
MCV: 93 fL (ref 80.0–100.0)
MPV: 10.8 fL (ref 7.5–12.5)
Monocytes Absolute: 522 {cells}/uL (ref 200–950)
Monocytes Relative: 6 %
Neutro Abs: 3567 {cells}/uL (ref 1500–7800)
Neutrophils Relative %: 41 %
Platelets: 265 10*3/uL (ref 140–400)
RBC: 4.3 MIL/uL (ref 3.80–5.10)
RDW: 13.8 % (ref 11.0–15.0)
WBC: 8.7 10*3/uL (ref 3.8–10.8)

## 2016-03-08 LAB — POCT URINALYSIS DIPSTICK
Bilirubin, UA: NEGATIVE
Blood, UA: NEGATIVE
Glucose, UA: NEGATIVE
Ketones, UA: NEGATIVE
Leukocytes, UA: NEGATIVE
Nitrite, UA: NEGATIVE
Protein, UA: NEGATIVE
Spec Grav, UA: 1.02
Urobilinogen, UA: 0.2
pH, UA: 7

## 2016-03-08 LAB — COMPREHENSIVE METABOLIC PANEL
ALT: 73 U/L — ABNORMAL HIGH (ref 6–29)
AST: 33 U/L — ABNORMAL HIGH (ref 10–30)
Albumin: 4.1 g/dL (ref 3.6–5.1)
Alkaline Phosphatase: 61 U/L (ref 33–115)
BUN: 16 mg/dL (ref 7–25)
CO2: 26 mmol/L (ref 20–31)
Calcium: 9.3 mg/dL (ref 8.6–10.2)
Chloride: 101 mmol/L (ref 98–110)
Creat: 0.93 mg/dL (ref 0.50–1.10)
Glucose, Bld: 87 mg/dL (ref 65–99)
Potassium: 3.7 mmol/L (ref 3.5–5.3)
Sodium: 138 mmol/L (ref 135–146)
Total Bilirubin: 0.4 mg/dL (ref 0.2–1.2)
Total Protein: 7.1 g/dL (ref 6.1–8.1)

## 2016-03-08 LAB — TSH: TSH: 1.67 m[IU]/L

## 2016-03-08 MED ORDER — ALBUTEROL SULFATE (2.5 MG/3ML) 0.083% IN NEBU
2.5000 mg | INHALATION_SOLUTION | Freq: Four times a day (QID) | RESPIRATORY_TRACT | Status: DC
  Administered 2016-03-08: 2.5 mg via RESPIRATORY_TRACT

## 2016-03-08 NOTE — Patient Instructions (Signed)
Take the Cetirizine once a day for breathing and allergies.    Come back to see me in 2-3 weeks.    Call and let me know about the ultrasound.

## 2016-03-08 NOTE — Progress Notes (Signed)
Dr. Gwendolyn GrantWalden requested a Behavioral Health Consult.   Presenting Issue:  Depression  Issues Discussed[facility]. [facility] is [facility], stating that she "needs to." However, she did not have time to speak at length [facility]. [facility] (03/22/16).

## 2016-03-08 NOTE — Progress Notes (Signed)
Subjective:    Debbie Hebert is a 45 y.o. female who [facility] to [facility] care and for depression:   1.  Depression:  Long-standing issue for patient. Please see prior office visit notes for details. She is currently being treated with Wellbutrin. Since last visit and states this is helped with her depression. She has not seen a counselor or therapist in the past. She still sleeps 9-10 hours at night. She has a little bit more energy during the day but daughter reports that she does sleep on the couch some during the day as well. No suicidal or homicidal ideation. No anxiety.  #2. Weight loss: -She continues to gain weight. She is concerned about her size. She did not overtly ask for any medications to treat obesity today as she did last visit. She has tried walking on the treadmill 1 since last visit. This resulted in some soreness in her calves she's not done this since then. Diet is poor. No lower extremity edema.  Family history:  No known family history of depression.  Does endorse history of hypertension and diabetes.   ROS as above per HPI.    The following portions of the patient's history were reviewed and updated as appropriate: allergies, current medications, past medical history, family and social history, and problem list. Patient is a nonsmoker.    PMH reviewed.  Past Medical History:  Diagnosis Date  . Diabetes mellitus without complication (HCC)   . Dysmenorrhea   . Gestational diabetes   . Hypertension    Past Surgical History:  Procedure Laterality Date  . ABDOMINAL SURGERY     LIPOSUCTION  . CESAREAN SECTION     X2  . COSMETIC SURGERY    . DILATION AND CURETTAGE OF UTERUS    . OOPHORECTOMY     RIGHT, [place]    Medications reviewed.    Objective:   Physical Exam BP (!) 127/95   Pulse 75   Temp 98.2 F (36.8 C) (Oral)   Ht 5\' 2"  (1.575 m)   Wt 207 lb 12.8 oz (94.3 kg)   LMP 01/27/2016   BMI 38.01 kg/m  Gen:  Alert,  cooperative patient who appears stated age in no acute distress.  Vital signs reviewed.  Obese HEENT: EOMI,  MMM Cardiac:  Regular rate and rhythm without murmur auscultated.  Good S1/S2. Pulm:  Minimal wheezing bilateral bases. Abd:  Obese and nontender. Exts: Non edematous BL  LE, warm and well perfused.  Psych:  Pleasant and conversant. Not depressed or anxious appearing. No hallucinations. No results found for this or any previous visit (from the past 72 hour(s)).  Imp/Plan: 1.  Depression: - She is doing better on her welbutrin.   - [facility] - come back in 2 weeks for reassessment.    2.  Wheezing:   - found on exam.   - nebulizer treatment here.  Lung exam s/p treatment much improved.   - cetirizine for seasonal allergies.    3.  Obesity:  - to increase activity.  Will also refer for nutirtion. - However currently we're working more on the joint her depression to increase her energy levels.  4. Ovarian cysts: -History of this in the past. She does have occasional cramping during her menses. She has had irregular periods for the past several years. These occur every 2-3 months. -We will check ultrasound once she has obtained insurance.

## 2016-03-22 ENCOUNTER — Ambulatory Visit: Payer: No Typology Code available for payment source

## 2016-03-22 ENCOUNTER — Ambulatory Visit: Payer: Medicaid Other

## 2016-03-22 ENCOUNTER — Ambulatory Visit: Payer: No Typology Code available for payment source | Admitting: Family Medicine

## 2016-03-25 ENCOUNTER — Other Ambulatory Visit: Payer: Self-pay

## 2016-03-25 DIAGNOSIS — R600 Localized edema: Secondary | ICD-10-CM

## 2016-03-25 MED ORDER — FUROSEMIDE 20 MG PO TABS: 20.0000 mg | ORAL_TABLET | Freq: Every day | ORAL | 0 refills | Status: DC

## 2016-03-29 ENCOUNTER — Telehealth: Payer: Self-pay

## 2016-03-29 NOTE — Telephone Encounter (Signed)
[facility] last week's missed appointment. Patient apologized for missed appointment, stating she had to attend to a problem with her son. She would like to reschedule an appointment with her PCP and with behavioral health. She plans to call the office to schedule these appointments.

## 2016-04-05 ENCOUNTER — Ambulatory Visit (INDEPENDENT_AMBULATORY_CARE_PROVIDER_SITE_OTHER): Payer: Self-pay | Admitting: Psychology

## 2016-04-05 DIAGNOSIS — F329 Major depressive disorder, single episode, unspecified: Secondary | ICD-10-CM

## 2016-04-05 DIAGNOSIS — F32A Depression, unspecified: Secondary | ICD-10-CM

## 2016-04-05 NOTE — Assessment & Plan Note (Addendum)
Assessment/Plan/Recommendations: Patient reports improving depressive symptoms, though still struggles with feelings of guilt and intrusive thoughts about losing her baby. She could benefit from doing some CBT to address these thoughts, as they are quite bothersome for her. She may also benefit from receiving more education about her ectopic pregnancy and the cause of the loss of her baby (i.e. Not due to patient's lack of prenatal care in first 3 months of pregnancy).  Additionally, patient is interested in losing weight and states she has difficulty controlling what she eats. [facility] asked patient to complete a food log over the next week to bring to next appointment.

## 2016-04-05 NOTE — Patient Instructions (Addendum)
Gracias por venir hoy! Durante la proxima semana, escribe todo lo que come Cardinal Healthdurante el dia, todos los dias, en un papel o cuaderno. Trae este papel a [place] proxima [place]a, que es a las 2 de la tarde, el 5 de Diciembre ([place]).

## 2016-04-05 NOTE — Progress Notes (Signed)
Reason for follow-up:  Depression  Issues discussed:  Patient discussed history of depression. She stated that about 19 years ago she experienced depression [place], as she was having some difficulty adjusting. She states that she took Zoloft at that time and that her depression lasted about a month or so. Her current depression started about a year ago when she had to terminate a pregnancy (patient could not recall her diagnosis but per chart it was ectopic pregnancy). She stated that after she terminated the pregnancy, she was severely depressed and sat on the couch all day without eating or bathing.   Patient states today that her symptoms have improved over the last two weeks. She feels that the Wellbutrin is starting to help, and has alleviated some symptoms, however she still feels guilty about the loss of her baby, as she feels that if she had known she was pregnant earlier, the baby may have been able to survive (patient did not know she was pregnant for 3 months - also expresses some mistrust of doctors, as she had been to her doctor at that time at another practice due to fatigue and they did not do a pregnancy test. She notes feeling more comfortable with her doctor here). Despite these feelings of guilt, she is now able to do more things during the day and rarely sits on the sofa now, preferring to keep busy. Patient reported taking Wellbutrin twice per day and states she hasn't missed any doses. She is bothered by weight gain, but denies other side effects. She feels very negatively about her weight, but at this time she is okay continuing to take the medication given the benefits she has noticed.

## 2016-04-12 ENCOUNTER — Ambulatory Visit (INDEPENDENT_AMBULATORY_CARE_PROVIDER_SITE_OTHER): Payer: Self-pay | Admitting: Family Medicine

## 2016-04-12 ENCOUNTER — Ambulatory Visit (INDEPENDENT_AMBULATORY_CARE_PROVIDER_SITE_OTHER): Payer: Self-pay | Admitting: Psychology

## 2016-04-12 VITALS — BP 124/88 | HR 79 | Temp 98.2°F | Ht 62.0 in | Wt 207.0 lb

## 2016-04-12 DIAGNOSIS — F329 Major depressive disorder, single episode, unspecified: Secondary | ICD-10-CM

## 2016-04-12 DIAGNOSIS — M25559 Pain in unspecified hip: Secondary | ICD-10-CM

## 2016-04-12 DIAGNOSIS — E669 Obesity, unspecified: Secondary | ICD-10-CM

## 2016-04-12 DIAGNOSIS — F32A Depression, unspecified: Secondary | ICD-10-CM

## 2016-04-12 DIAGNOSIS — N938 Other specified abnormal uterine and vaginal bleeding: Secondary | ICD-10-CM

## 2016-04-12 DIAGNOSIS — E119 Type 2 diabetes mellitus without complications: Secondary | ICD-10-CM

## 2016-04-12 LAB — POCT GLYCOSYLATED HEMOGLOBIN (HGB A1C): Hemoglobin A1C: 6.1

## 2016-04-12 MED ORDER — NALTREXONE-BUPROPION HCL ER 8-90 MG PO TB12: ORAL_TABLET | ORAL | 1 refills | Status: DC

## 2016-04-12 MED ORDER — CETIRIZINE HCL 10 MG PO TABS: 10.0000 mg | ORAL_TABLET | Freq: Every day | ORAL | 11 refills | Status: DC

## 2016-04-12 NOTE — Progress Notes (Signed)
Reason for follow-up:  Depression, weight gain  Issues discussed: Patient reports medication has been very helpful for her depressive symptoms, PHQ score of 3 today, indicating minimal symptoms. She is still struggling to lose weight despite trips to the gym 6 days per week. Patient discussed past program she [facility] (at [facility], per patient). This program was held about 2 years ago and she states she did not lose weight despite adhering closely to the program for 1 month. After this, she felt very disappointed and lost motivation. When [facility] asked how long she expected it would take to lose weight, she said 6 months, but also stated that after 1 month she would have liked to lose at least a couple of pounds. Patient expressed concern that she might have a hormonal or thyroid condition and plans to ask her doctor if tests have been run to look for this.   Finally, [facility] brought up patient's history of ectopic pregnancy and asked again if her previous doctors had spoken with her about the cause. Patient states that her doctors at that time did not discuss how the pregnancy happened. [facility] explained to patient that this condition would not have been preventable even if she had received prenatal care in the first few months. Patient expressed great relief at learning this and stated that she will also ask her doctor about this today.

## 2016-04-12 NOTE — Patient Instructions (Addendum)
Take the naltrexan-bupropion medication for weight loss as described: 1 pill daily for 1 week, 1 tab in morning and evening for second week, 2 tabs in the morning and 1 tab in the evening for week 3, 2 pills in the morning and evening for week 4.  Use the cetirizine for itching for the next several days. This will make it go away.  Come back and see me in about 6 weeks we can see how things are going.

## 2016-04-12 NOTE — Assessment & Plan Note (Signed)
Assessment/Plan/Recommendations: Patient reports depressive symptoms have decreased greatly. She denied problems with her medication. She continues to have difficulty losing weight and would like for her doctors to look into any potential medical causes for this. She plans to discuss this with her doctor today. If no medical cause can be found, patient is interested in receiving [facility] call from [facility] to discuss scheduling appointments to help with motivation and accountability to stick to her diet.

## 2016-04-12 NOTE — Progress Notes (Signed)
Subjective:   [employer] interpreter [phone] Debbie Hebert used today for visit:   Debbie Hebert is a 45 y.o. female [facility] concerns over her weight:  1.  Weight loss: She is still very interested in weight loss, and discouraged by the fact she cannot lose weight.  States she is being more active and eating better.  Depression improved (see below).  States 1-2 years ago she was ~130 lbs.  Wants to be that skinny again.  No LE edema.  No recent illnesses.  She is asking for Phentermine for weight loss.  2.  Depression:  Much better.  Doing well with the welbutrin.  [facility], which have also been helpful.  She is not sleeping throughout the day as she was previously.  Has been able to be more active as she has had more energy with her depression under better control. Denies any anxiety. No suicidal or homicidal ideation.  ROS as above per HPI.    The following portions of the patient's history were reviewed and updated as appropriate: allergies, current medications, past medical history, family and social history, and problem list. Patient is a nonsmoker.    PMH reviewed.  Past Medical History:  Diagnosis Date  . Diabetes mellitus without complication (HCC)   . Dysmenorrhea   . Gestational diabetes   . Hypertension    Past Surgical History:  Procedure Laterality Date  . ABDOMINAL SURGERY     LIPOSUCTION  . CESAREAN SECTION     X2  . COSMETIC SURGERY    . DILATION AND CURETTAGE OF UTERUS    . OOPHORECTOMY     RIGHT, WAS [place]    Medications reviewed. Current Outpatient Prescriptions  Medication Sig Dispense Refill  . buPROPion (WELLBUTRIN) 75 MG tablet Take 1 tablet (75 mg total) by mouth 2 (two) times daily. 60 tablet 1  . cyanocobalamin 1000 MCG tablet Take 1,000 mcg by mouth daily.    . furosemide (LASIX) 20 MG tablet Take 1 tablet (20 mg total) by mouth daily. 15 tablet 0  . medroxyPROGESTERone (PROVERA) 10 MG  tablet Take 1 tablet (10 mg total) by mouth daily. 30 tablet 4  . metFORMIN (GLUCOPHAGE) 500 MG tablet Take 1 tablet (500 mg total) by mouth 2 (two) times daily with a meal. Reported on 05/05/2015 60 tablet 5  . polyethylene glycol powder (GLYCOLAX/MIRALAX) powder Take 17 g by mouth daily. (Patient not taking: Reported on 02/25/2016) 250 g 1   No current facility-administered medications for this visit.      Objective:   Physical Exam BP 124/88   Pulse 79   Temp 98.2 F (36.8 C) (Oral)   Ht 5\' 2"  (1.575 m)   Wt 207 lb (93.9 kg)   LMP 01/22/2016 (Approximate)   BMI 37.86 kg/m  Gen:  Alert, cooperative patient who appears stated age in no acute distress.  Vital signs reviewed. HEENT: EOMI,  MMM Cardiac:  Regular rate and rhythm  Pulm:  Clear to auscultation bilaterally with good air movement.     Abd:  Soft/nondistended/nontender.  Obese Exts: Non edematous BL  LE, warm and well perfused.   Results for orders placed or performed in visit on 04/12/16 (from the past 72 hour(s))  HgB A1c     Status: None   Collection Time: 04/12/16  2:50 PM  Result Value Ref Range   Hemoglobin A1C 6.1

## 2016-04-13 NOTE — Assessment & Plan Note (Signed)
Much improved.  We are switching her to naltrexone-bupropion for weight loss.  Hold welbutrin while we start this.

## 2016-04-13 NOTE — Assessment & Plan Note (Signed)
She still does not yet have insurance. She has an appointment set up already with OB/GYN. We'll hold off on ultrasound due to financial reasons.

## 2016-04-13 NOTE — Assessment & Plan Note (Signed)
Discouraged.   There seems to be some disconnect between her weight loss goals and where she used to be as far as weight.  States she used to weigh around 120/130 lbs in past year or so.  I showed her the weights we have in Epic, which for the past 5 years she has weighed essentially the same around 200 pounds. Discussed that I will not provide her phentermine because of the adverse side effects. We can try naltrexone/bupropion instead. She is not to take this plus her bupropion for depression. Follow-up in 2 weeks to assess for any changes in her depression.

## 2016-04-13 NOTE — Assessment & Plan Note (Signed)
Controlled.   Needs to have screenings done.  Will hold off for next visit or even the one after so as not to overwhelm her.

## 2016-04-15 ENCOUNTER — Telehealth: Payer: Self-pay

## 2016-04-15 DIAGNOSIS — M25559 Pain in unspecified hip: Secondary | ICD-10-CM

## 2016-04-15 NOTE — Telephone Encounter (Signed)
LVM for pt to call the office. Please inform her she has an US scheduled for Next Thursday [facility]21/2016 [facility]. She needs to arrive 15 minutes early and have a full bladder. Sunday SpillersSharon T Penelopi Mikrut, CMA

## 2016-04-15 NOTE — Telephone Encounter (Signed)
Hi Dr. Gwendolyn GrantWalden,  I had to add a transvag US to pt US order. Would you please esign the order?  Thank you, Melvenia BeamShari

## 2016-04-18 ENCOUNTER — Telehealth: Payer: Self-pay

## 2016-04-18 NOTE — Telephone Encounter (Signed)
Called pt and LVM using pacific interpreters [place](Sergio, [phone]). If pt calls, please inform her she [facility] @ 8:00 am. She will need to have a full bladder. Sunday SpillersSharon T Nakshatra Klose, CMA

## 2016-04-19 ENCOUNTER — Telehealth: Payer: Self-pay

## 2016-04-21 ENCOUNTER — Ambulatory Visit (HOSPITAL_COMMUNITY): Payer: Self-pay | Attending: Family Medicine

## 2016-04-22 NOTE — Telephone Encounter (Signed)
Error

## 2016-04-26 ENCOUNTER — Telehealth: Payer: Self-pay

## 2016-04-26 NOTE — Telephone Encounter (Signed)
[facility] to follow up per plan with patient to discuss future appointments for depression and diet management. Patient states she is leaving for [place] tomorrow but asked [facility] to call during the first week of January to discuss future appointments. [facility] will continue to follow.

## 2016-04-27 ENCOUNTER — Encounter: Payer: Self-pay | Admitting: Gynecology

## 2016-04-27 ENCOUNTER — Other Ambulatory Visit: Payer: Self-pay | Admitting: Family Medicine

## 2016-04-27 ENCOUNTER — Other Ambulatory Visit: Payer: Self-pay | Admitting: Gynecology

## 2016-04-27 NOTE — Telephone Encounter (Signed)
Needs annual gyn exam in Jan or Feb 2018

## 2016-05-24 ENCOUNTER — Telehealth: Payer: Self-pay

## 2016-05-24 NOTE — Telephone Encounter (Signed)
[facility] calling to check in, left voicemail letting patient know that she can call office if she'd like to make an appointment with me, and that I'll try her again next week.

## 2016-05-31 ENCOUNTER — Telehealth: Payer: Self-pay

## 2016-05-31 NOTE — Telephone Encounter (Signed)
Patient answered phone, stated that things are going well but she is still in [place]. She'll be returning in February and will give me a call then if she needs an appointment.

## 2016-09-05 ENCOUNTER — Other Ambulatory Visit: Payer: Self-pay | Admitting: Gynecology

## 2016-09-21 ENCOUNTER — Encounter: Payer: Self-pay | Admitting: Gynecology

## 2017-03-24 ENCOUNTER — Encounter: Payer: Self-pay | Admitting: Gastroenterology

## 2017-04-06 ENCOUNTER — Encounter: Payer: Self-pay | Admitting: Gastroenterology

## 2017-04-06 ENCOUNTER — Other Ambulatory Visit (INDEPENDENT_AMBULATORY_CARE_PROVIDER_SITE_OTHER): Payer: Self-pay

## 2017-04-06 ENCOUNTER — Encounter (INDEPENDENT_AMBULATORY_CARE_PROVIDER_SITE_OTHER): Payer: Self-pay

## 2017-04-06 ENCOUNTER — Ambulatory Visit: Payer: Self-pay | Admitting: Gastroenterology

## 2017-04-06 VITALS — BP 110/76 | HR 76 | Ht 60.6 in | Wt 212.0 lb

## 2017-04-06 DIAGNOSIS — R1012 Left upper quadrant pain: Secondary | ICD-10-CM

## 2017-04-06 DIAGNOSIS — R1013 Epigastric pain: Secondary | ICD-10-CM

## 2017-04-06 DIAGNOSIS — K219 Gastro-esophageal reflux disease without esophagitis: Secondary | ICD-10-CM

## 2017-04-06 DIAGNOSIS — R945 Abnormal results of liver function studies: Secondary | ICD-10-CM

## 2017-04-06 DIAGNOSIS — K760 Fatty (change of) liver, not elsewhere classified: Secondary | ICD-10-CM

## 2017-04-06 DIAGNOSIS — R7989 Other specified abnormal findings of blood chemistry: Secondary | ICD-10-CM

## 2017-04-06 LAB — COMPREHENSIVE METABOLIC PANEL
ALT: 126 U/L — ABNORMAL HIGH (ref 0–35)
AST: 73 U/L — ABNORMAL HIGH (ref 0–37)
Albumin: 4.2 g/dL (ref 3.5–5.2)
Alkaline Phosphatase: 69 U/L (ref 39–117)
BUN: 13 mg/dL (ref 6–23)
CO2: 25 meq/L (ref 19–32)
Calcium: 9.6 mg/dL (ref 8.4–10.5)
Chloride: 102 meq/L (ref 96–112)
Creatinine, Ser: 0.82 mg/dL (ref 0.40–1.20)
GFR: 79.97 mL/min (ref >60.00)
Glucose, Bld: 145 mg/dL — ABNORMAL HIGH (ref 70–99)
Potassium: 4 meq/L (ref 3.5–5.1)
Sodium: 136 meq/L (ref 135–145)
Total Bilirubin: 0.8 mg/dL (ref 0.2–1.2)
Total Protein: 7.9 g/dL (ref 6.0–8.3)

## 2017-04-06 LAB — CBC WITH DIFFERENTIAL/PLATELET
Basophils Absolute: 0.1 10*3/uL (ref 0.0–0.1)
Basophils Relative: 1.3 % (ref 0.0–3.0)
Eosinophils Absolute: 0.2 10*3/uL (ref 0.0–0.7)
Eosinophils Relative: 2.5 % (ref 0.0–5.0)
HCT: 40.8 % (ref 36.0–46.0)
Hemoglobin: 13.9 g/dL (ref 12.0–15.0)
Lymphocytes Relative: 41 % (ref 12.0–46.0)
Lymphs Abs: 2.9 10*3/uL (ref 0.7–4.0)
MCHC: 34.1 g/dL (ref 30.0–36.0)
MCV: 94.4 fl (ref 78.0–100.0)
Monocytes Absolute: 0.4 10*3/uL (ref 0.1–1.0)
Monocytes Relative: 5.4 % (ref 3.0–12.0)
Neutro Abs: 3.6 10*3/uL (ref 1.4–7.7)
Neutrophils Relative %: 49.8 % (ref 43.0–77.0)
Platelets: 220 10*3/uL (ref 150.0–400.0)
RBC: 4.32 Mil/uL (ref 3.87–5.11)
RDW: 13 % (ref 11.5–15.5)
WBC: 7.2 10*3/uL (ref 4.0–10.5)

## 2017-04-06 LAB — FERRITIN: Ferritin: 63.9 ng/mL (ref 10.0–291.0)

## 2017-04-06 LAB — PROTIME-INR
INR: 1.1 ratio — ABNORMAL HIGH (ref 0.8–1.0)
Prothrombin Time: 12.1 s (ref 9.6–13.1)

## 2017-04-06 LAB — LIPASE: Lipase: 25 U/L (ref 11.0–59.0)

## 2017-04-06 LAB — IBC PANEL
Iron: 164 ug/dL — ABNORMAL HIGH (ref 42–145)
Saturation Ratios: 36.6 % (ref 20.0–50.0)
Transferrin: 320 mg/dL (ref 212.0–360.0)

## 2017-04-06 LAB — IGA: IgA: 411 mg/dL — ABNORMAL HIGH (ref 68–378)

## 2017-04-06 LAB — TSH: TSH: 3.55 u[IU]/mL (ref 0.35–4.50)

## 2017-04-06 MED ORDER — RANITIDINE HCL 150 MG PO TABS: 150.0000 mg | ORAL_TABLET | Freq: Every day | ORAL | 3 refills | Status: DC

## 2017-04-06 NOTE — Patient Instructions (Addendum)
Go to the basement for labs today  We have sent Zantac to your pharmacy  Continue Vitamin E daily    Hgado graso (Fatty Liver) El hgado graso, tambin llamado esteatosis heptica o esteatohepatitis, es una enfermedad en la que se acumula demasiada grasa en las clulas del hgado. El hgado elimina las sustancias dainas del torrente sanguneo y produce lquidos que el cuerpo necesita. Tambin ayuda al cuerpo a Chemical engineerutilizar y Academic librarianalmacenar la energa obtenida de los alimentos que come. En muchos casos, el hgado graso no provoca sntomas ni problemas. Con frecuencia, se diagnostica cuando se realizan estudios por otros motivos. Sin embargo, con Museum/gallery conservatorel tiempo, el hgado graso puede provocar una inflamacin que puede causar problemas hepticos ms graves, como fibrosis heptica (cirrosis). CAUSAS Las causas del hgado graso pueden incluir las siguientes:  Beber alcohol en exceso.  Dficit nutricional.  Obesidad.  Sndrome de [place].  Diabetes.  Hiperlipidemia.  Embarazo.  Determinados medicamentos.  Txicos.  Algunas infecciones virales. FACTORES DE RIESGO Puede tener ms probabilidades de tener hgado graso si:  Consume alcohol en exceso.  Est embarazada.  Tiene sobrepeso.  Tiene diabetes.  Tiene hepatitis.  Tiene un nivel alto de triglicridos. SIGNOS Y SNTOMAS El hgado graso con frecuencia no provoca sntomas. En los casos en que se presentan sntomas, estos pueden incluir:  Fatiga.  Debilidad.  Prdida de peso.  Confusin.  Dolor abdominal.  Color amarillo en la piel y en la zona Arabela de los ojos (ictericia).  Nuseas y vmitos. DIAGNSTICO El hgado graso se puede diagnosticar mediante lo siguiente:  Un examen fsico y Neomia Dearuna historia clnica.  Anlisis de Troysangre.  Estudios de diagnstico por imgenes, como ecografa, tomografa computarizada (TC) o Health visitorresonancia magntica (RM).  Biopsia de hgado. Se extrae una pequea muestra de tejido del hgado  usando Portugaluna aguja. La muestra se examina en el microscopio. TRATAMIENTO El hgado graso con frecuencia es causado por otras enfermedades. El tratamiento para el hgado graso puede incluir medicamentos y cambios en el estilo de vida para controlar enfermedades como:  Alcoholismo.  Colesterol elevado.  Diabetes.  Exceso de Pomeroypeso u obesidad. INSTRUCCIONES PARA EL CUIDADO EN EL HOGAR  Siga una dieta saludable como se lo haya indicado el mdico.  Haga ejercicios regularmente. Esto puede ayudarlo a Liberty Globalbajar de peso, y a Public house managercontrolar el colesterol y la diabetes. Hable con el mdico sobre qu actividades son mejores para usted y IT trainerelaboren un plan de ejercicios.  No beba alcohol.  Tome los medicamentos solamente como se lo haya indicado el mdico. SOLICITE ATENCIN MDICA SI: Tiene dificultad para controlar lo siguiente:  Nivel de Bankerazcar en la sangre.  Colesterol.  Consumo de alcohol. SOLICITE ATENCIN MDICA DE INMEDIATO SI:  Siente dolor abdominal.  Tiene ictericia.  Tiene nuseas o vmitos. Esta informacin no tiene Theme park managercomo fin reemplazar el consejo del mdico. Asegrese de hacerle al mdico cualquier pregunta que tenga. Document Released: 02/13/2013 Document Revised: 05/16/2014 Document Reviewed: 09/04/2013 Elsevier Interactive Patient Education  2018 ArvinMeritorElsevier Inc.    Opciones de alimentos para pacientes con reflujo gastroesofgico - Adultos (Food Choices for Gastroesophageal Reflux Disease, Adult) Cuando se tiene reflujo gastroesofgico (ERGE), los alimentos que se ingieren y los hbitos de alimentacin son Engineer, productionmuy importantes. Elegir los alimentos adecuados puede ayudar a Altria Groupaliviar las molestias. QU PAUTAS DEBO SEGUIR?  Elija las frutas, los vegetales, los cereales integrales y los productos lcteos con bajo contenido de Princetongrasa.  Elija las carnes de Long Hollowvaca, de pescado y de ave con bajo contenido de grasas.  Limite  las grasas, [address]como los Sheridanaceites, los aderezos para Princetonensalada, la Capulinmanteca, los  frutos secos y Programme researcher, broadcasting/film/videoel aguacate.  Lleve un registro de alimentos. Esto ayuda a identificar los alimentos que ocasionan sntomas.  Evite los alimentos que le ocasionen sntomas. Pueden ser distintos para cada persona.  Haga comidas pequeas durante Glass blower/designerel da en lugar de 3 comidas abundantes.  Coma lentamente, en un lugar donde est distendido.  Limite el consumo de alimentos fritos.  Cocine los alimentos utilizando mtodos que no sean la fritura.  Evite el consumo alcohol.  Evite beber grandes cantidades de lquidos con las comidas.  Evite agacharse o recostarse hasta despus de 2 o 3horas de haber comido.  QU ALIMENTOS NO SE RECOMIENDAN? Estos son algunos alimentos y bebidas que pueden empeorar los sntomas: Veterinary surgeonVegetales Tomates. Jugo de tomate. Salsa de tomate y espagueti. Ajes. Cebolla y Daltonajo. Rbano picante. Frutas Naranjas, pomelos y limn (fruta y Sloveniajugo). Carnes Carnes de Howellsvaca, de pescado y de ave con gran contenido de grasas. Esto incluye los perros calientes, las Lenapahcostillas, el Ramonajamn, la salchicha, el salame y el tocino. Lcteos Leche entera y Beaverleche chocolatada. PPG IndustriesCrema cida. Crema. Mantequilla. Helados. Queso crema. Bebidas T o caf. Bebidas gaseosas o bebidas energizantes. Condimentos Salsa picante. Salsa barbacoa. Dulces/postres Chocolate y cacao. Rosquillas. Menta y mentol. Grasas y Du Pontaceites Alimentos muy grasos. Esto incluye las papas fritas. Otros Vinagre. Especias picantes. Esto incluye la pimienta negra, la pimienta Leah, la pimienta roja, la pimienta de cayena, el curry en polvo, los clavos de Highwoodolor, el jengibre y el Arubachile en polvo. Esta no es Raytheonuna lista completa de los alimentos y las bebidas que se Theatre stage managerdeben evitar. Comunquese con el nutricionista para recibir ms informacin. Esta informacin no tiene Theme park managercomo fin reemplazar el consejo del mdico. Asegrese de hacerle al mdico cualquier pregunta que tenga. Document Released: 10/25/2011 Document Revised: 05/16/2014 Document  Reviewed: 02/27/2013 Elsevier Interactive Patient Education  2017 ArvinMeritorElsevier Inc.

## 2017-04-06 NOTE — Progress Notes (Signed)
Debbie Hebert    [id number]    06-30-1970  Primary Care Physician:Ames, Ellwood Sayers, PA  Referring Physician: Alveda Reasons, MD [address] [place], [place] [address]  Chief complaint:  Elevated LFT, GERD  HPI:  24 yr F with history of diabetes, obesity here for new patient evaluation with abnormal LFTs. Patient had elevated AST 194 and ALT 499 on February 28, 2017.  Albumin 4.2, alk phos 99 ,bilirubin 0.6, othewise BMP and CBC within normal limits.  Prior to that AST 275 and ALT 521 on February 21, 2017.  Vitamin b12 was 1317 and ferritin was 357  Hepatitis A, B and C negative.  Right upper quadrant ultrasound March 02, 2017 showed hepatic steatosis otherwise unremarkable exam.  She is trying to lose weight with diet and exercise but has been unsuccessful.  She was taking phentermine, stopped it a few months ago.  She is also taking herbal remedies to improve her liver function and weight loss. She is on medroxyprogesterone for PCOS.  Outpatient Encounter Medications as of 04/06/2017  Medication Sig  . buPROPion (WELLBUTRIN) 75 MG tablet Take 1 tablet (75 mg total) by mouth 2 (two) times daily.  . cetirizine (ZYRTEC) 10 MG tablet Take 1 tablet (10 mg total) by mouth daily.  . cyanocobalamin 1000 MCG tablet Take 1,000 mcg by mouth daily.  . furosemide (LASIX) 20 MG tablet Take 1 tablet (20 mg total) by mouth daily.  . medroxyPROGESTERone (PROVERA) 10 MG tablet Take 1 tablet (10 mg total) by mouth daily.  . metFORMIN (GLUCOPHAGE) 500 MG tablet TAKE 1 TABLET (500 MG TOTAL) BY MOUTH 2 (TWO) TIMES DAILY WITH A MEAL. REPORTED ON 05/05/2015  . Naltrexone-Bupropion HCl ER 8-90 MG TB12 1 tab po daily x 1 week; 1 tab po bid week 2; 2 tabs AM and 1 tab PM week 3; 2 tabs PO daily week 4  . polyethylene glycol powder (GLYCOLAX/MIRALAX) powder Take 17 g by mouth daily. (Patient not taking: Reported on 02/25/2016)   No facility-administered encounter medications on  file as of 04/06/2017.     Allergies as of 04/06/2017  . (No Known Allergies)    Past Medical History:  Diagnosis Date  . Diabetes mellitus without complication ([place])   . Dysmenorrhea   . Gestational diabetes   . Hypertension     Past Surgical History:  Procedure Laterality Date  . ABDOMINAL SURGERY     LIPOSUCTION  . CESAREAN SECTION     X2  . COSMETIC SURGERY    . DILATION AND CURETTAGE OF UTERUS    . OOPHORECTOMY     RIGHT, WAS DONE IN [place]    Family History  Problem Relation Age of Onset  . Diabetes Mother   . Hypertension Mother   . Hypertension Sister   . Diabetes Sister   . Hypertension Paternal Grandmother   . Hypertension Father   . Colon cancer Neg Hx   . Stomach cancer Neg Hx     Social History   Socioeconomic History  . Marital status: Married    Spouse name: Not on file  . Number of children: Not on file  . Years of education: Not on file  . Highest education level: Not on file  Social Needs  . Financial resource strain: Not on file  . Food insecurity - worry: Not on file  . Food insecurity - inability: Not on file  . Transportation needs - medical:  Not on file  . Transportation needs - non-medical: Not on file  Occupational History  . Not on file  Tobacco Use  . Smoking status: Never Smoker  . Smokeless tobacco: Never Used  Substance and Sexual Activity  . Alcohol use: No    Alcohol/week: 0.0 oz  . Drug use: No  . Sexual activity: Yes    Partners: Male    Birth control/protection: Condom  Other Topics Concern  . Not on file  Social History Narrative  . Not on file      Review of systems: Review of Systems  Constitutional: Negative for fever and chills. Positive for fatigue HENT: Negative.   Eyes: Negative for blurred vision.  Respiratory: Negative for cough, shortness of breath and wheezing.   Cardiovascular: Negative for chest pain and palpitations.  Gastrointestinal: as per HPI Genitourinary: Negative for dysuria,  urgency, frequency and hematuria.  Musculoskeletal: Positive for myalgias, back pain and joint pain.  Skin: Negative for itching and rash.  Neurological: Negative for dizziness, tremors, focal weakness, seizures and loss of consciousness.  Endo/Heme/Allergies: Positive for seasonal allergies.  Psychiatric/Behavioral: Negative for  suicidal ideas and hallucinations. Positive for depression All other systems reviewed and are negative.   Physical Exam: Vitals:   04/06/17 0927  BP: 110/76  Pulse: 76   Body mass index is 40.59 kg/m. Gen:      No acute distress HEENT:  EOMI, sclera anicteric Neck:     No masses; no thyromegaly Lungs:    Clear to auscultation bilaterally; normal respiratory effort CV:         Regular rate and rhythm; no murmurs Abd:      + bowel sounds; soft, mild epigastric tenderness; no palpable masses, no distension Ext:    No edema; adequate peripheral perfusion Skin:      Warm and dry; no rash Neuro: alert and oriented x 3 Psych: normal mood and affect  Data Reviewed:  Reviewed labs, radiology imaging, old records and pertinent past GI work up   Assessment and Plan/Recommendations:  46 year old female with abnormal LFTs predominantly elevated transaminases here for evaluation Transaminitis likely secondary to drug-induced liver injury versus fatty liver Viral hepatitis negative No mass lesions on abdominal ultrasound Check ANA, anti-smooth muscle antibody, antimitochondrial antibody, alpha-1 antitrypsin level to exclude any other underlying liver disease Mild elevation in ferritin could be secondary to inflammation/acute phase reactant We will recheck LFT, ferritin, iron panel. Advised patient to avoid NSAIDs, herbal remedies and weight loss medication Continue low-carb low-fat diet and regular exercise with goal weight loss 10% of body weight in the next 3-6 months We will start vitamin D 400 international units daily Patient said she has elevated  cholesterol level, if not contraindicated to start statins as high cholesterol and triglycerides could also be contributing to liver injury and elevated transaminases.  Will need close monitoring of LFT after starting statins if indicated.  I do not have recent results of her lipid panel.  Intermittent heartburn and GERD: Antireflux measures Ranitidine at bedtime as needed  Return in 2-3 months or sooner if needed    Damaris Hippo , MD [phone] Mon-Fri 8a-5p [phone] after 5p, weekends, holidays  CC: Alveda Reasons, MD

## 2017-04-07 LAB — ANA: Anti Nuclear Antibody(ANA): NEGATIVE

## 2017-04-11 LAB — MITOCHONDRIAL ANTIBODIES: Mitochondrial M2 Ab, IgG: <=20 U

## 2017-04-11 LAB — EPSTEIN-BARR VIRUS VCA ANTIBODY PANEL
EBV NA IgG: 138 U/mL — ABNORMAL HIGH
EBV VCA IgG: 266 U/mL — ABNORMAL HIGH
EBV VCA IgM: <36 U/mL

## 2017-04-11 LAB — TISSUE TRANSGLUTAMINASE, IGA: (tTG) Ab, IgA: 1 U/mL

## 2017-04-11 LAB — ANTI-SMOOTH MUSCLE ANTIBODY, IGG: Actin (Smooth Muscle) Antibody (IGG): <20 U (ref <20)

## 2017-04-11 LAB — ALPHA-1-ANTITRYPSIN: A-1 Antitrypsin, Ser: 167 mg/dL (ref 83–199)

## 2017-06-06 ENCOUNTER — Ambulatory Visit: Payer: Medicaid Other | Admitting: Gastroenterology

## 2017-09-10 IMAGING — US US MFM OB TRANSVAGINAL
1 series · 15 of 28 positions shown · non-contrast
Comparison: none

[Series 1: us mfm ob transvaginal · 15 of 34 slices shown]
[im 1/34]
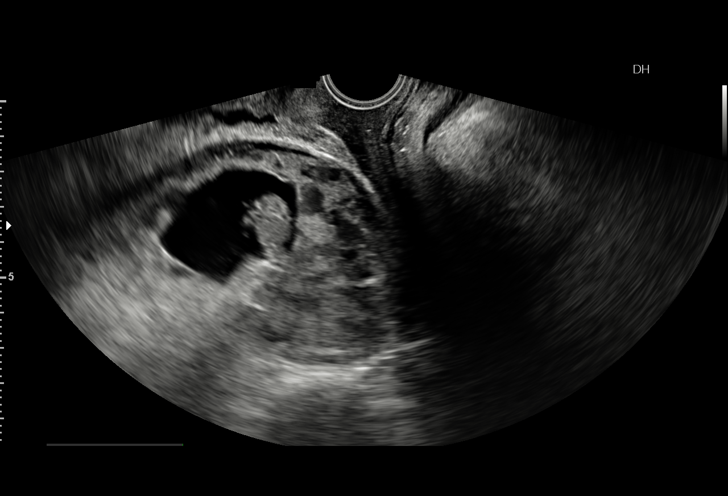
[im 3/34]
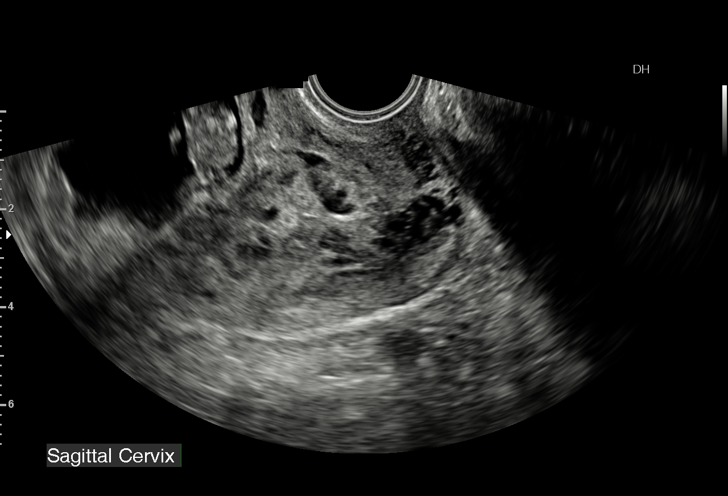
[im 5/34]
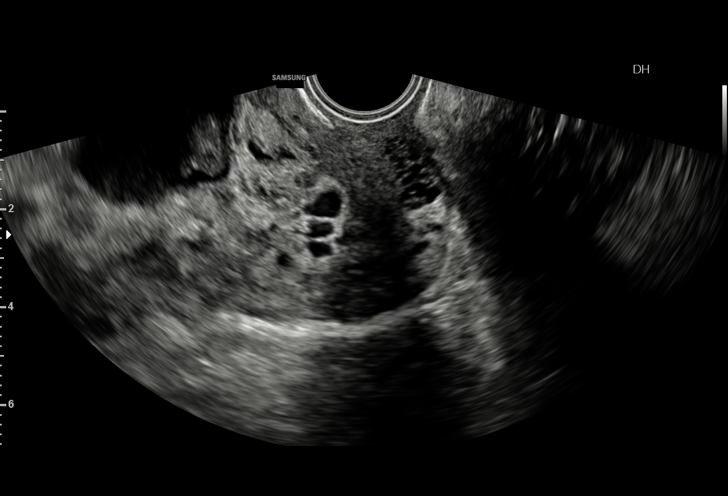
[im 8/34]
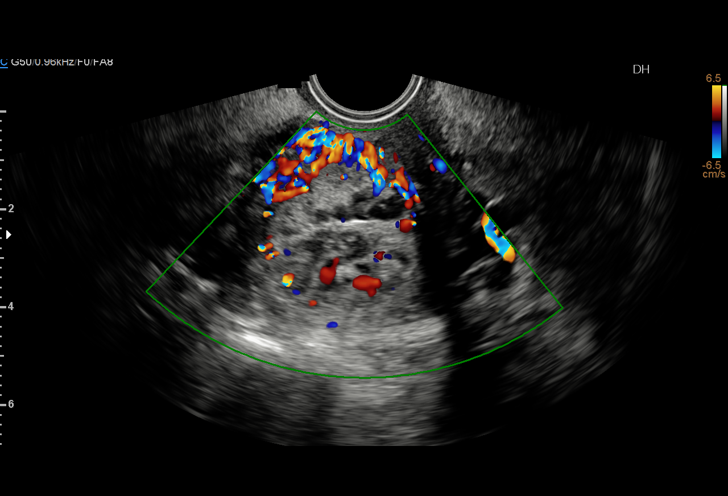
[im 10/34]
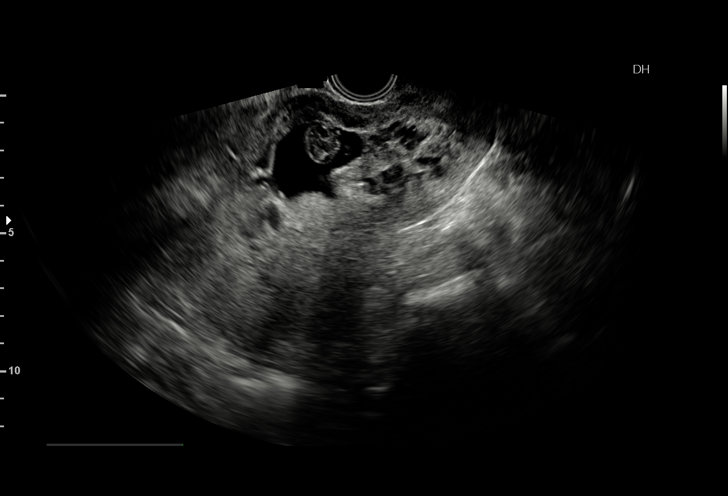
[im 13/34]
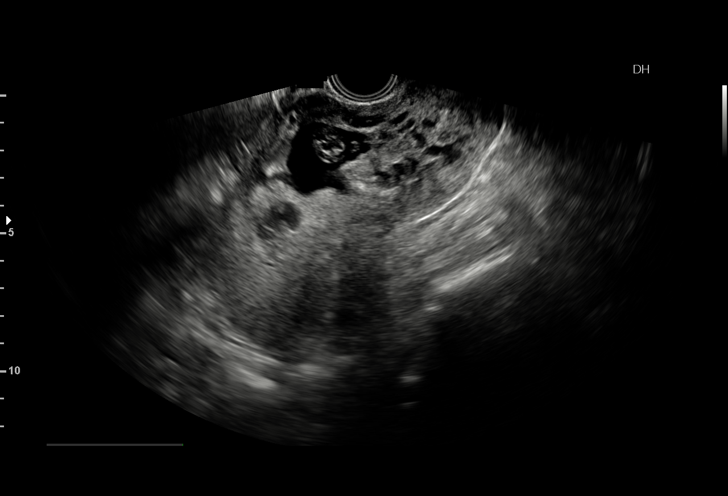
[im 15/34]
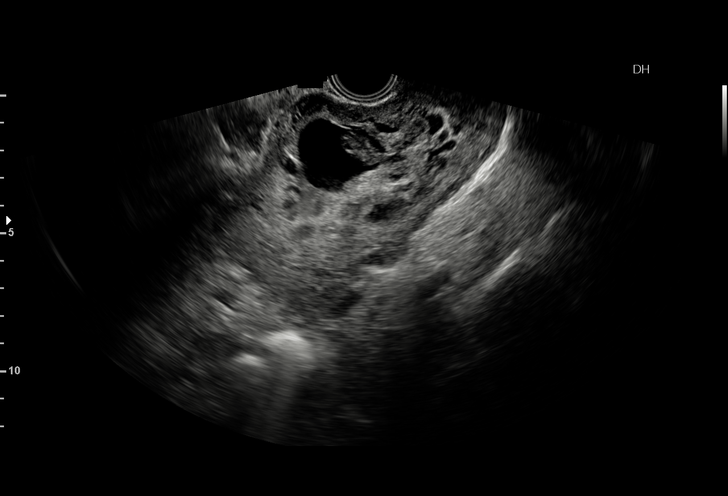
[im 18/34]
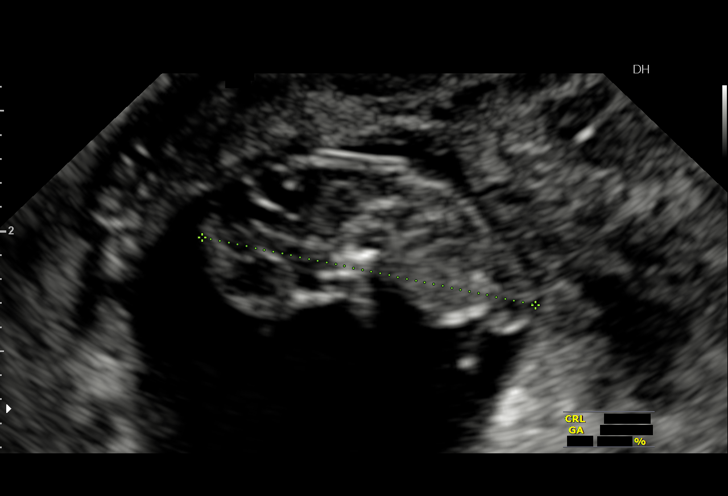
[im 19/34]
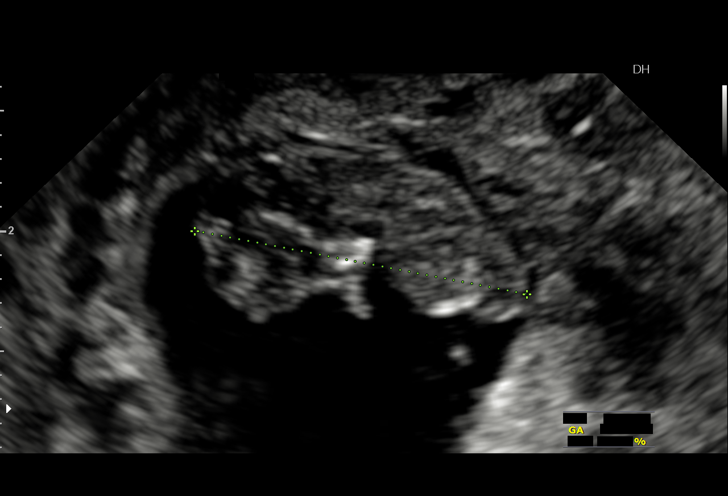
[im 21/34]
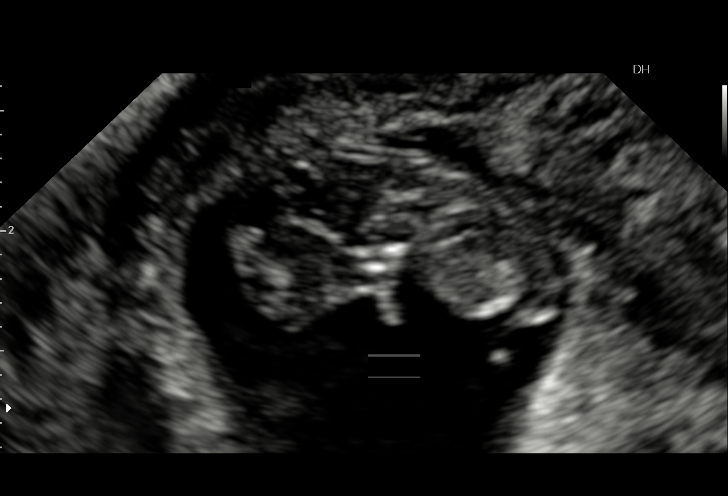
[im 24/34]
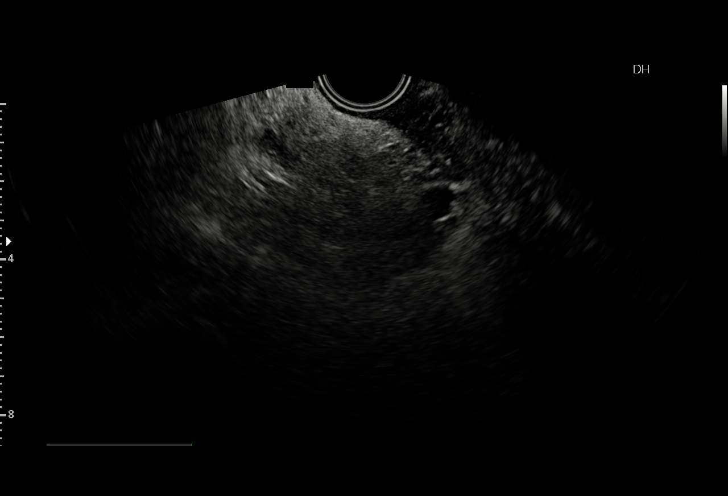
[im 26/34]
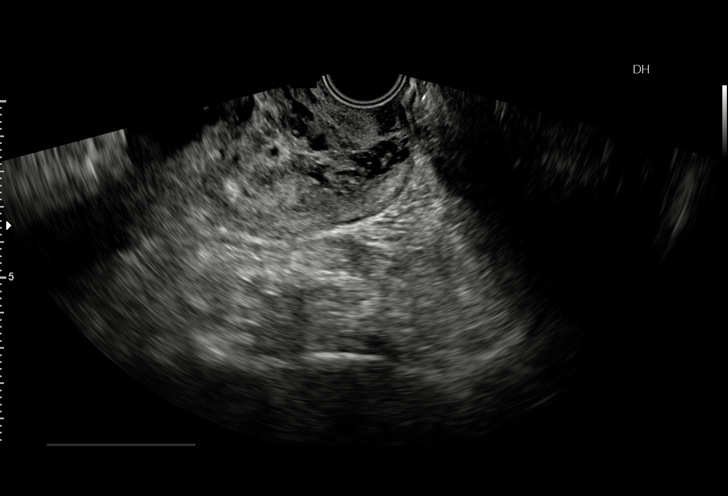
[im 29/34]
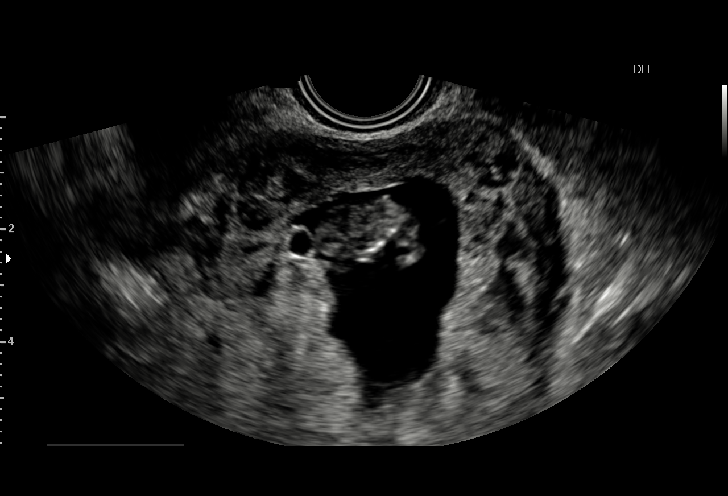
[im 31/34]
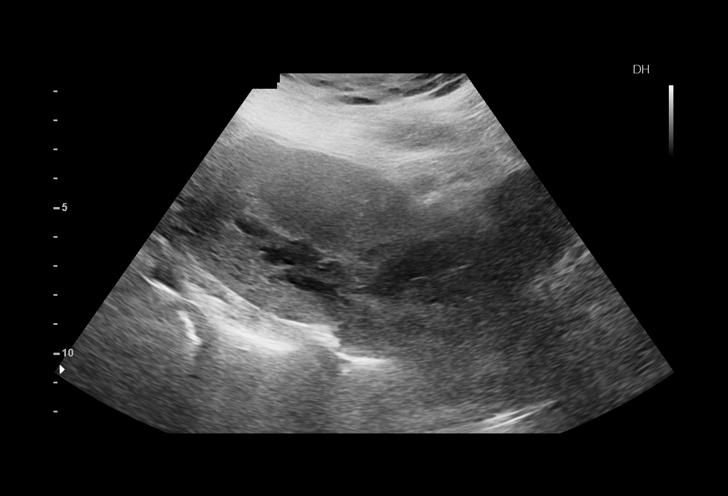
[im 34/34]
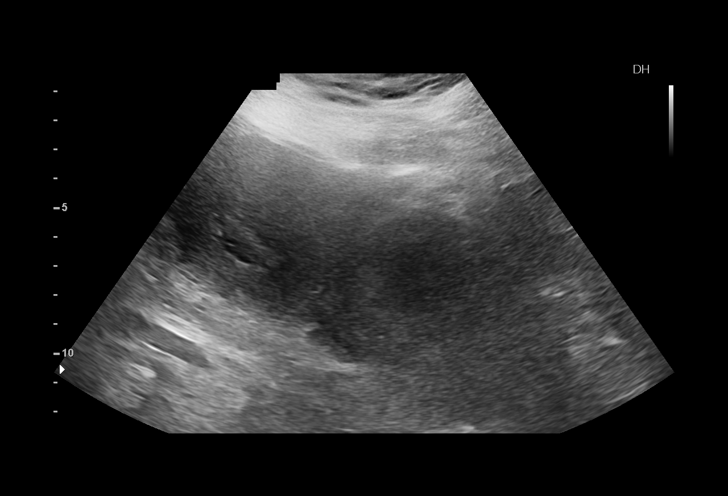

[15 of 28 positions shown; findings below may reference images not displayed]

1  AVADANEI MATTHIEU            [id number]      [id number]     [id number]
Indications

9 weeks gestation of pregnancy
Evaluation for a suspected ectopic
pregnancy
Pre-existing diabetes, type 2, in pregnancy,
first trimester
Pre-existing essential hypertension
complicating pregnancy, first trimester
Advanced maternal age multigravida 35+,
first trimester
Previous cesarean delivery x2, antepartum
OB History

Gravidity:    3         Term:   2        Prem:   0        SAB:   0
TOP:          0       Ectopic:  0        Living: 2
Fetal Evaluation

Num Of Fetuses:     1
Preg. Location:     Ectopic in LUS, cesarean scar
Gest. Sac:          Ectopic
Yolk Sac:           Visualized
Fetal Pole:         Visualized
Cardiac Activity:   Observed
Biometry

CRL:      28.2  mm     G. Age:  9w 3d                   EDD:   12/08/15
Gestational Age
LMP:           11w 3d        Date:  02/17/15                 EDD:   11/24/15
Best:          9w 3d      Det. By:  Early Ultrasound         EDD:   12/08/15
(05/05/15)
Cervix Uterus Adnexa

Cervix
Contains chorionic tissue

Adnexa:       No abnormality visualized.
Impression

Single IUP at 9w 3d
A viable fetal embryo is noted with cardiac activity.
The pregnancy appears to be implanted in the lower uterine
segment consistent with a C-section scar ectopic
The gestational sac appears to balloon toward the fetal
bladder.  The gestational sac does not appear to extend into
the uterine fundus.
The cervical stroma appears irregular ? chorionic tissue
within the cervix.

Ultrasound findings and limitations were discussed with the
patient - reviewed concerns / risks of uterine rupture and
need to terminate the pregnancy.
Recommendations

See separate consult note.
Images were reviewed and discussed with Dr. Layeq -
he will meet with the couple this afternoon for possible
conservative management of C-section scan ectopic.
Attending Physician, NORIA

## 2018-01-10 ENCOUNTER — Ambulatory Visit: Payer: Self-pay | Admitting: Family Medicine

## 2018-04-12 ENCOUNTER — Encounter: Payer: Self-pay | Admitting: Family Medicine

## 2018-04-12 ENCOUNTER — Ambulatory Visit (INDEPENDENT_AMBULATORY_CARE_PROVIDER_SITE_OTHER): Payer: Self-pay

## 2018-04-12 ENCOUNTER — Ambulatory Visit: Payer: Self-pay | Admitting: Family Medicine

## 2018-04-12 ENCOUNTER — Other Ambulatory Visit: Payer: Self-pay

## 2018-04-12 VITALS — BP 132/85 | HR 104 | Temp 98.6°F | Ht 61.5 in | Wt 223.0 lb

## 2018-04-12 DIAGNOSIS — R1011 Right upper quadrant pain: Secondary | ICD-10-CM

## 2018-04-12 DIAGNOSIS — R300 Dysuria: Secondary | ICD-10-CM

## 2018-04-12 DIAGNOSIS — R110 Nausea: Secondary | ICD-10-CM

## 2018-04-12 DIAGNOSIS — Z8719 Personal history of other diseases of the digestive system: Secondary | ICD-10-CM

## 2018-04-12 DIAGNOSIS — R1031 Right lower quadrant pain: Secondary | ICD-10-CM

## 2018-04-12 LAB — POCT URINALYSIS DIP (MANUAL ENTRY)
Bilirubin, UA: NEGATIVE
Blood, UA: NEGATIVE
Glucose, UA: NEGATIVE mg/dL
Leukocytes, UA: NEGATIVE
Nitrite, UA: NEGATIVE
Protein Ur, POC: NEGATIVE mg/dL
Spec Grav, UA: >=1.03 — AB (ref 1.010–1.025)
Urobilinogen, UA: 0.2 U/dL
pH, UA: 5.5 (ref 5.0–8.0)

## 2018-04-12 LAB — POCT CBC
Granulocyte percent: 54.8 % (ref 37–80)
HCT, POC: 40.6 % (ref 29–41)
Hemoglobin: 13.6 g/dL — AB (ref 9.5–13.5)
Lymph, poc: 3.8 — AB (ref 0.6–3.4)
MCH, POC: 31 pg (ref 27–31.2)
MCHC: 33.4 g/dL (ref 31.8–35.4)
MCV: 92.8 fL (ref 76–111)
MID (cbc): 0.4 (ref 0–0.9)
MPV: 8.8 fL (ref 0–99.8)
POC Granulocyte: 5.1 (ref 2–6.9)
POC LYMPH PERCENT: 41.2 % (ref 10–50)
POC MID %: 4 % (ref 0–12)
Platelet Count, POC: 260 10*3/uL (ref 142–424)
RBC: 4.37 M/uL (ref 4.04–5.48)
RDW, POC: 12.1 %
WBC: 9.3 10*3/uL (ref 4.6–10.2)

## 2018-04-12 LAB — POC MICROSCOPIC URINALYSIS (UMFC): Mucus: ABSENT

## 2018-04-12 LAB — POCT URINE PREGNANCY: Preg Test, Ur: NEGATIVE

## 2018-04-12 MED ORDER — AMOXICILLIN-POT CLAVULANATE 875-125 MG PO TABS: 1.0000 | ORAL_TABLET | Freq: Two times a day (BID) | ORAL | 0 refills | Status: AC

## 2018-04-12 NOTE — Patient Instructions (Addendum)
Start antibiotic for possible colitis, but recheck in 48 hours to determine next step. I am checking liver tests to decide if we need to obtain ultrasound of gallbladder or possible CT scan of abdomen. Return to the clinic or go to the nearest emergency room if any of your symptoms worsen or new symptoms occur.  Avoid fried/fatty foods for now.   Dolor abdominal en adultos Abdominal Pain, Adult El dolor abdominal puede tener muchas causas. A menudo, no es grave y [place] sin tratamiento o con tratamiento en la casa. Sin embargo, a Facilities managerveces el dolor abdominal es intenso. El mdico revisar sus antecedentes mdicos y le har un examen fsico para tratar de Production assistant, radiodeterminar la causa del dolor abdominal. Siga estas instrucciones en su casa:  Tome los medicamentos de venta libre y los recetados solamente como se lo haya indicado el mdico. No tome un laxante a menos que se lo haya indicado el mdico.  Beba suficiente lquido para Pharmacologistmantener la orina clara o de color amarillo plido.  Controle su afeccin para ver si hay cambios.  Concurra a todas las visitas de control como se lo haya indicado el mdico. Esto es importante. Comunquese con un mdico si:  El dolor abdominal cambia o empeora.  No tiene apetito o baja de peso sin proponrselo.  Est estreido o tiene diarrea durante ms de 2 o 3das.  Tiene dolor cuando orina o defeca.  El dolor abdominal lo despierta de noche.  El dolor empeora con las comidas, despus de comer o con determinados alimentos.  Tiene vmitos y no puede retener nada.  Tiene fiebre. Solicite ayuda de inmediato si:  El dolor no desaparece tan pronto como el mdico le dijo que era esperable.  No puede detener los vmitos.  El Engineer, miningdolor se siente solo en zonas del abdomen, como el lado derecho o la parte inferior izquierda del abdomen.  Las heces son sanguinolentas o de color negro, o de aspecto alquitranado.  Tiene dolor intenso, clicos, o meteorismo en el  abdomen.  Tiene signos de deshidratacin, por ejemplo: ? Larose KellsOrina oscura, muy escasa o falta de [place]. ? Labios agrietados. ? [place] Holdings[place] sec[place]. ? Ojos hundidos. ? Somnolencia. ? Debilidad. Esta informacin no tiene Theme park managercomo fin reemplazar el consejo del mdico. Asegrese de hacerle al mdico cualquier pregunta que tenga. Document Released: 04/25/2005 Document Revised: 04/14/2016 Document Reviewed: 10/07/2015 Elsevier Interactive Patient Education  [employer]2018 Elsevier Inc.    If you have lab work done today you will be contacted with your lab results within the next 2 weeks.  If you have not heard from us then please contact us. The fastest way to get your results is to register for My Chart.   IF you received an x-ray today, you will receive an invoice from [facility]. Please contact [facility] at [phone] with questions or concerns regarding your invoice.   IF you received labwork today, you will receive an invoice from [facility]. Please contact [facility] at [phone] with questions or concerns regarding your invoice.   Our billing staff will not be able to assist you with questions regarding bills from these companies.  You will be contacted with the lab results as soon as they are available. The fastest way to get your results is to activate your My Chart account. Instructions are located on the last page of this paperwork. If you have not heard from us regarding the results in 2 weeks, please contact this office.

## 2018-04-12 NOTE — Progress Notes (Signed)
Subjective:    Patient ID: Debbie Hebert, female    DOB: 1970-06-08, 47 y.o.   MRN: [id number]  HPI Spanish speaking - refused video interpreter. DTR will translating.   Complains of abdominal pain that radiates to back for past 2 weeks. No fever, occasional nausea and pain after eating, but pain there when not eating as well. No worsening, just not going away. No known hx of appendix or gallbladder surgery. No blood in stool or stool changes.  Feels bloated past 8 months.  Burning with urination for 1 week. No blood in urine. No hx of kidney stones.   Diagnosed with colitis in [place] 6 months ago. Treated with meds only.  Similar pain currently.   Last BM 3 hours ago - min diarrhea. Denies straining or hard stools. BM every day.   Tx:  otc heartburn medicine - 2 per day. Unknown name.    Review of Systems  Genitourinary: Positive for dysuria. Negative for hematuria.      Objective:   Physical Exam  Constitutional: She is oriented to person, place, and time. She appears well-developed and well-nourished.  HENT:  Head: Normocephalic and atraumatic.  Eyes: Pupils are equal, round, and reactive to light. Conjunctivae and EOM are normal.  Neck: Carotid bruit is not present.  Cardiovascular: Normal rate, regular rhythm, normal heart sounds and intact distal pulses.  Pulmonary/Chest: Effort normal and breath sounds normal.  Abdominal: Soft. Bowel sounds are normal. She exhibits no pulsatile midline mass. There is tenderness (diffuse R sided and suprapubic ttp. ) in the right upper quadrant and right lower quadrant. There is CVA tenderness (R sided. ), tenderness at McBurney's point and positive Murphy's sign.  Negative heel jar  Neurological: She is alert and oriented to person, place, and time.  Skin: Skin is warm and dry.  Psychiatric: She has a normal mood and affect. Her behavior is normal.  Vitals reviewed.  Vitals:   04/12/18 1552  BP: 132/85  Pulse: (!) 104  Temp:  98.6 F (37 C)  TempSrc: Oral  SpO2: 98%  Weight: 223 lb (101.2 kg)  Height: 5' 1.5" (1.562 m)   Results for orders placed or performed in visit on 04/12/18  POCT CBC  Result Value Ref Range   WBC 9.3 4.6 - 10.2 K/uL   Lymph, poc 3.8 (A) 0.6 - 3.4   POC LYMPH PERCENT 41.2 10 - 50 %L   MID (cbc) 0.4 0 - 0.9   POC MID % 4.0 0 - 12 %M   POC Granulocyte 5.1 2 - 6.9   Granulocyte percent 54.8 37 - 80 %G   RBC 4.37 4.04 - 5.48 M/uL   Hemoglobin 13.6 (A) 9.5 - 13.5 g/dL   HCT, POC 57.8 29 - 41 %   MCV 92.8 76 - 111 fL   MCH, POC 31.0 27 - 31.2 pg   MCHC 33.4 31.8 - 35.4 g/dL   RDW, POC 46.9 %   Platelet Count, POC 260 142 - 424 K/uL   MPV 8.8 0 - 99.8 fL  POCT Microscopic Urinalysis ([facility])  Result Value Ref Range   WBC,UR,HPF,POC None None WBC/hpf   RBC,UR,HPF,POC None None RBC/hpf   Bacteria None None, Too numerous to count   Mucus Absent Absent   Epithelial Cells, UR Per Microscopy Few (A) None, Too numerous to count cells/hpf  POCT urinalysis dipstick  Result Value Ref Range   Color, UA yellow yellow   Clarity, UA clear clear   Glucose,  UA negative negative mg/dL   Bilirubin, UA negative negative   Ketones, POC UA trace (5) (A) negative mg/dL   Spec Grav, UA >=1.610 (A) 1.010 - 1.025   Blood, UA negative negative   pH, UA 5.5 5.0 - 8.0   Protein Ur, POC negative negative mg/dL   Urobilinogen, UA 0.2 0.2 or 1.0 E.U./dL   Nitrite, UA Negative Negative   Leukocytes, UA Negative Negative       Assessment & Plan:    Debbie Hebert is a 47 y.o. female RUQ abdominal pain - Plan: Comprehensive metabolic panel, POCT CBC, DG Abd 1 View, amoxicillin-clavulanate (AUGMENTIN) 875-125 MG tablet  Dysuria - Plan: POCT Microscopic Urinalysis ([facility]), POCT urinalysis dipstick, POCT urine pregnancy  RLQ abdominal pain - Plan: POCT urine pregnancy, DG Abd 1 View, amoxicillin-clavulanate (AUGMENTIN) 875-125 MG tablet  Nausea without vomiting  History of colitis - Plan:  amoxicillin-clavulanate (AUGMENTIN) 875-125 MG tablet  Reports longer standing abdominal bloating symptoms, but past few weeks with pain, primarily right upper quadrant, some to right lower quadrant.  Endorses some dysuria and nausea.  Pain radiates to back.  Urinalysis without concerns.  CBC overall reassuring.  Reports history of colitis, possible recurrence but exam and history also concerning for possible gallbladder disease.  Afebrile at present, normal WBC.  -Started on Augmentin for possible early colitis/diverticulitis  -Check CMP, and if significant elevated LFTs would evaluate with ultrasound for possible cholecystitis with ER precautions given if any acute worsening  -Consider CT depending on LFTs to evaluate for diverticulitis/colitis.  -Recheck 48 hours to determine next step.  ER precautions if worsening sooner.  Meds ordered this encounter  Medications  . amoxicillin-clavulanate (AUGMENTIN) 875-125 MG tablet    Sig: Take 1 tablet by mouth 2 (two) times daily.    Dispense:  20 tablet    Refill:  0   Patient Instructions   Start antibiotic for possible colitis, but recheck in 48 hours to determine next step. I am checking liver tests to decide if we need to obtain ultrasound of gallbladder or possible CT scan of abdomen. Return to the clinic or go to the nearest emergency room if any of your symptoms worsen or new symptoms occur.  Avoid fried/fatty foods for now.   Dolor abdominal en adultos Abdominal Pain, Adult El dolor abdominal puede tener muchas causas. A menudo, no es grave y [place] sin tratamiento o con tratamiento en la casa. Sin embargo, a Facilities manager abdominal es intenso. El mdico revisar sus antecedentes mdicos y le har un examen fsico para tratar de Production assistant, radio causa del dolor abdominal. Siga estas instrucciones en su casa:  Tome los medicamentos de venta libre y los recetados solamente como se lo haya indicado el mdico. No tome un laxante a menos que se  lo haya indicado el mdico.  Beba suficiente lquido para Pharmacologist la orina clara o de color amarillo plido.  Controle su afeccin para ver si hay cambios.  Concurra a todas las visitas de control como se lo haya indicado el mdico. Esto es importante. Comunquese con un mdico si:  El dolor abdominal cambia o empeora.  No tiene apetito o baja de peso sin proponrselo.  Est estreido o tiene diarrea durante ms de 2 o 3das.  Tiene dolor cuando orina o defeca.  El dolor abdominal lo despierta de noche.  El dolor empeora con las comidas, despus de comer o con determinados alimentos.  Tiene vmitos y no puede retener nada.  Tiene  fiebre. Solicite ayuda de inmediato si:  El dolor no desaparece tan pronto como el mdico le dijo que era esperable.  No puede detener los vmitos.  El Engineer, miningdolor se siente solo en zonas del abdomen, como el lado derecho o la parte inferior izquierda del abdomen.  Las heces son sanguinolentas o de color negro, o de aspecto alquitranado.  Tiene dolor intenso, clicos, o meteorismo en el abdomen.  Tiene signos de deshidratacin, por ejemplo: ? Larose [place]ellsOrina oscura, muy escasa o falta de [place]. ? Labios agrietados. ? [employer] [employer]. ? Ojos hundidos. ? Somnolencia. ? Debilidad. Esta informacin no tiene Theme park managercomo fin reemplazar el consejo del mdico. Asegrese de hacerle al mdico cualquier pregunta que tenga. Document Released: 04/25/2005 Document Revised: 04/14/2016 Document Reviewed: 10/07/2015 Elsevier Interactive Patient Education  [employer]2018 Else[employer] Inc.    If you have lab work done today you will be contacted with your lab results within the next 2 weeks.  If you have not heard from us then please contact us. The fastest way to get your results is to register for My Chart.   IF you received an x-ray today, you will receive an invoice from [facility]. Please contact [facility] at [phone] with questions or concerns regarding your invoice.    IF you received labwork today, you will receive an invoice from [facility]. Please contact [facility] at [phone] with questions or concerns regarding your invoice.   Our billing staff will not be able to assist you with questions regarding bills from these companies.  You will be contacted with the lab results as soon as they are available. The fastest way to get your results is to activate your My Chart account. Instructions are located on the last page of this paperwork. If you have not heard from us regarding the results in 2 weeks, please contact this office.       Signed,   Meredith StaggersJeffrey Munachimso Palin, MD [facility].  04/14/18 8:57 AM

## 2018-04-12 NOTE — Progress Notes (Signed)
Subjective:    Patient ID: Debbie Hebert, female    DOB: 1970-08-21, 47 y.o.   MRN: [id number]  HPI  Debbie Hebert is a 47 y.o. female Presents today for: Chief Complaint  Patient presents with  . blaoted    after eating pt gets stomach pains  . Abdominal Pain    X 2 months     Patient Active Problem List   Diagnosis Date Noted  . Depression 04/05/2016  . Amenorrhea 12/04/2015  . Type 2 diabetes mellitus (HCC) 05/31/2015  . Absolute anemia 05/16/2015  . Essential hypertension 05/05/2015  . Obesity 12/12/2014  . Weight gain 04/12/2012  . DUB (dysfunctional uterine bleeding) 04/12/2012   Past Medical History:  Diagnosis Date  . Diabetes mellitus without complication (HCC)   . Dysmenorrhea   . Gestational diabetes   . Hypertension    Past Surgical History:  Procedure Laterality Date  . ABDOMINAL SURGERY     LIPOSUCTION  . CESAREAN SECTION     X2  . COSMETIC SURGERY    . DILATION AND CURETTAGE OF UTERUS    . OOPHORECTOMY     RIGHT, WAS DONE IN [place]   No Known Allergies Prior to Admission medications   Medication Sig Start Date End Date Taking? Authorizing Provider  metFORMIN (GLUCOPHAGE) 500 MG tablet TAKE 1 TABLET (500 MG TOTAL) BY MOUTH 2 (TWO) TIMES DAILY WITH A MEAL. REPORTED ON 05/05/2015 04/27/16  Yes Ok Edwards, MD  pravastatin (PRAVACHOL) 20 MG tablet TAKE 1 TABLET BY MOUTH EVERYDAY AT BEDTIME 01/06/18  Yes [provider]  vitamin E 400 UNIT capsule Take 400 Units by mouth daily.   Yes [provider]   Social History   Socioeconomic History  . Marital status: Married    Spouse name: Not on file  . Number of children: Not on file  . Years of education: Not on file  . Highest education level: Not on file  Occupational History  . Not on file  Social Needs  . Financial resource strain: Not on file  . Food insecurity:    Worry: Not on file    Inability: Not on file  . Transportation needs:    Medical: Not on  file    Non-medical: Not on file  Tobacco Use  . Smoking status: Never Smoker  . Smokeless tobacco: Never Used  Substance and Sexual Activity  . Alcohol use: No    Alcohol/week: 0.0 standard drinks  . Drug use: No  . Sexual activity: Yes    Partners: Male    Birth control/protection: Condom  Lifestyle  . Physical activity:    Days per week: Not on file    Minutes per session: Not on file  . Stress: Not on file  Relationships  . Social connections:    Talks on phone: Not on file    Gets together: Not on file    Attends religious service: Not on file    Active member of club or organization: Not on file    Attends meetings of clubs or organizations: Not on file    Relationship status: Not on file  . Intimate partner violence:    Fear of current or ex partner: Not on file    Emotionally abused: Not on file    Physically abused: Not on file    Forced sexual activity: Not on file  Other Topics Concern  . Not on file  Social History Narrative  . Not on file  Review of Systems     Objective:   Physical Exam Vitals:   04/12/18 1552  BP: 132/85  Pulse: (!) 104  Temp: 98.6 F (37 C)  TempSrc: Oral  SpO2: 98%  Weight: 223 lb (101.2 kg)  Height: 5' 1.5" (1.562 m)          Assessment & Plan:

## 2018-04-13 LAB — COMPREHENSIVE METABOLIC PANEL
ALT: 60 IU/L — ABNORMAL HIGH (ref 0–32)
AST: 37 IU/L (ref 0–40)
Albumin/Globulin Ratio: 1.4 (ref 1.2–2.2)
Albumin: 4 g/dL (ref 3.5–5.5)
Alkaline Phosphatase: 76 IU/L (ref 39–117)
BUN/Creatinine Ratio: 20 (ref 9–23)
BUN: 16 mg/dL (ref 6–24)
Bilirubin Total: 0.3 mg/dL (ref 0.0–1.2)
CO2: 22 mmol/L (ref 20–29)
Calcium: 9.5 mg/dL (ref 8.7–10.2)
Chloride: 102 mmol/L (ref 96–106)
Creatinine, Ser: 0.82 mg/dL (ref 0.57–1.00)
GFR calc Af Amer: 99 mL/min/{1.73_m2} (ref >59)
GFR calc non Af Amer: 86 mL/min/{1.73_m2} (ref >59)
Globulin, Total: 2.9 g/dL (ref 1.5–4.5)
Glucose: 106 mg/dL — ABNORMAL HIGH (ref 65–99)
Potassium: 4.4 mmol/L (ref 3.5–5.2)
Sodium: 140 mmol/L (ref 134–144)
Total Protein: 6.9 g/dL (ref 6.0–8.5)

## 2018-04-16 ENCOUNTER — Telehealth: Payer: Self-pay | Admitting: *Deleted

## 2018-04-16 NOTE — Telephone Encounter (Signed)
Spoke with patient she will call back tomorrow with the decision about ordering the CT exam  Wanted a day to think about it

## 2020-08-15 IMAGING — DX DG ABDOMEN 1V
3 series · 3 of 3 positions shown · non-contrast
Comparison: 09/21/2015

CLINICAL DATA: Right abdominal pain.  Evaluate for increased stool.

EXAM:
ABDOMEN - 1 VIEW

[abdomen kub (1 of 3)]
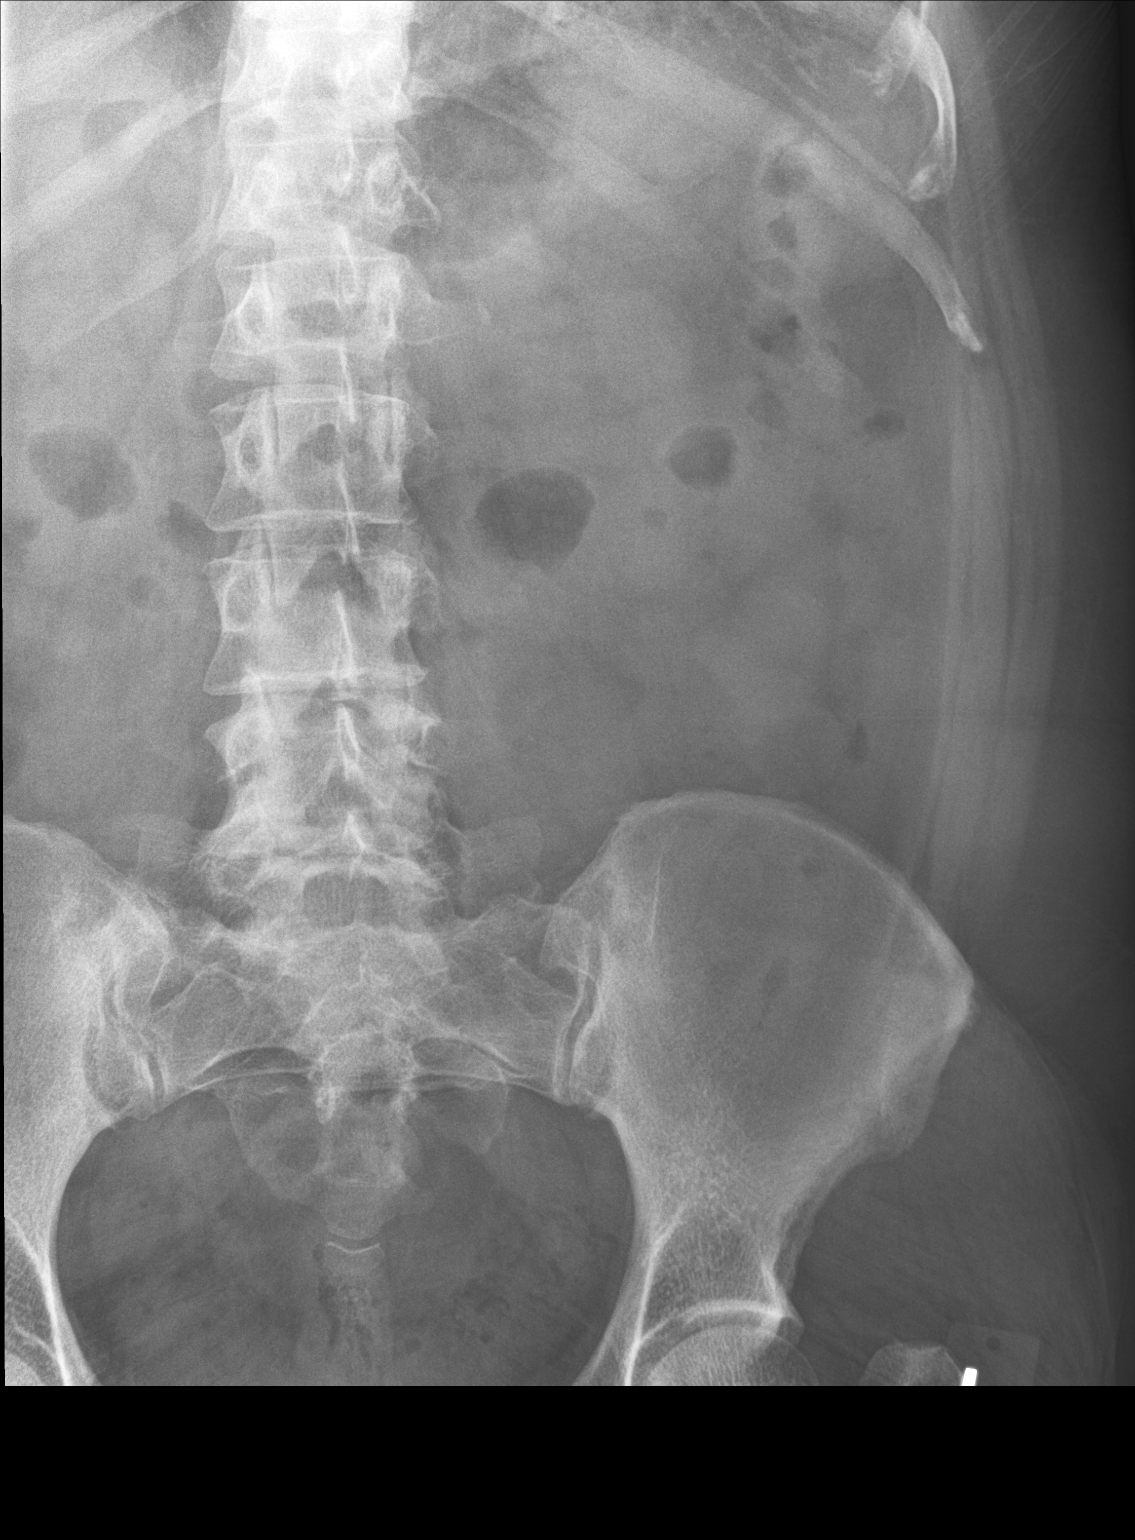

[abdomen kub (2 of 3)]
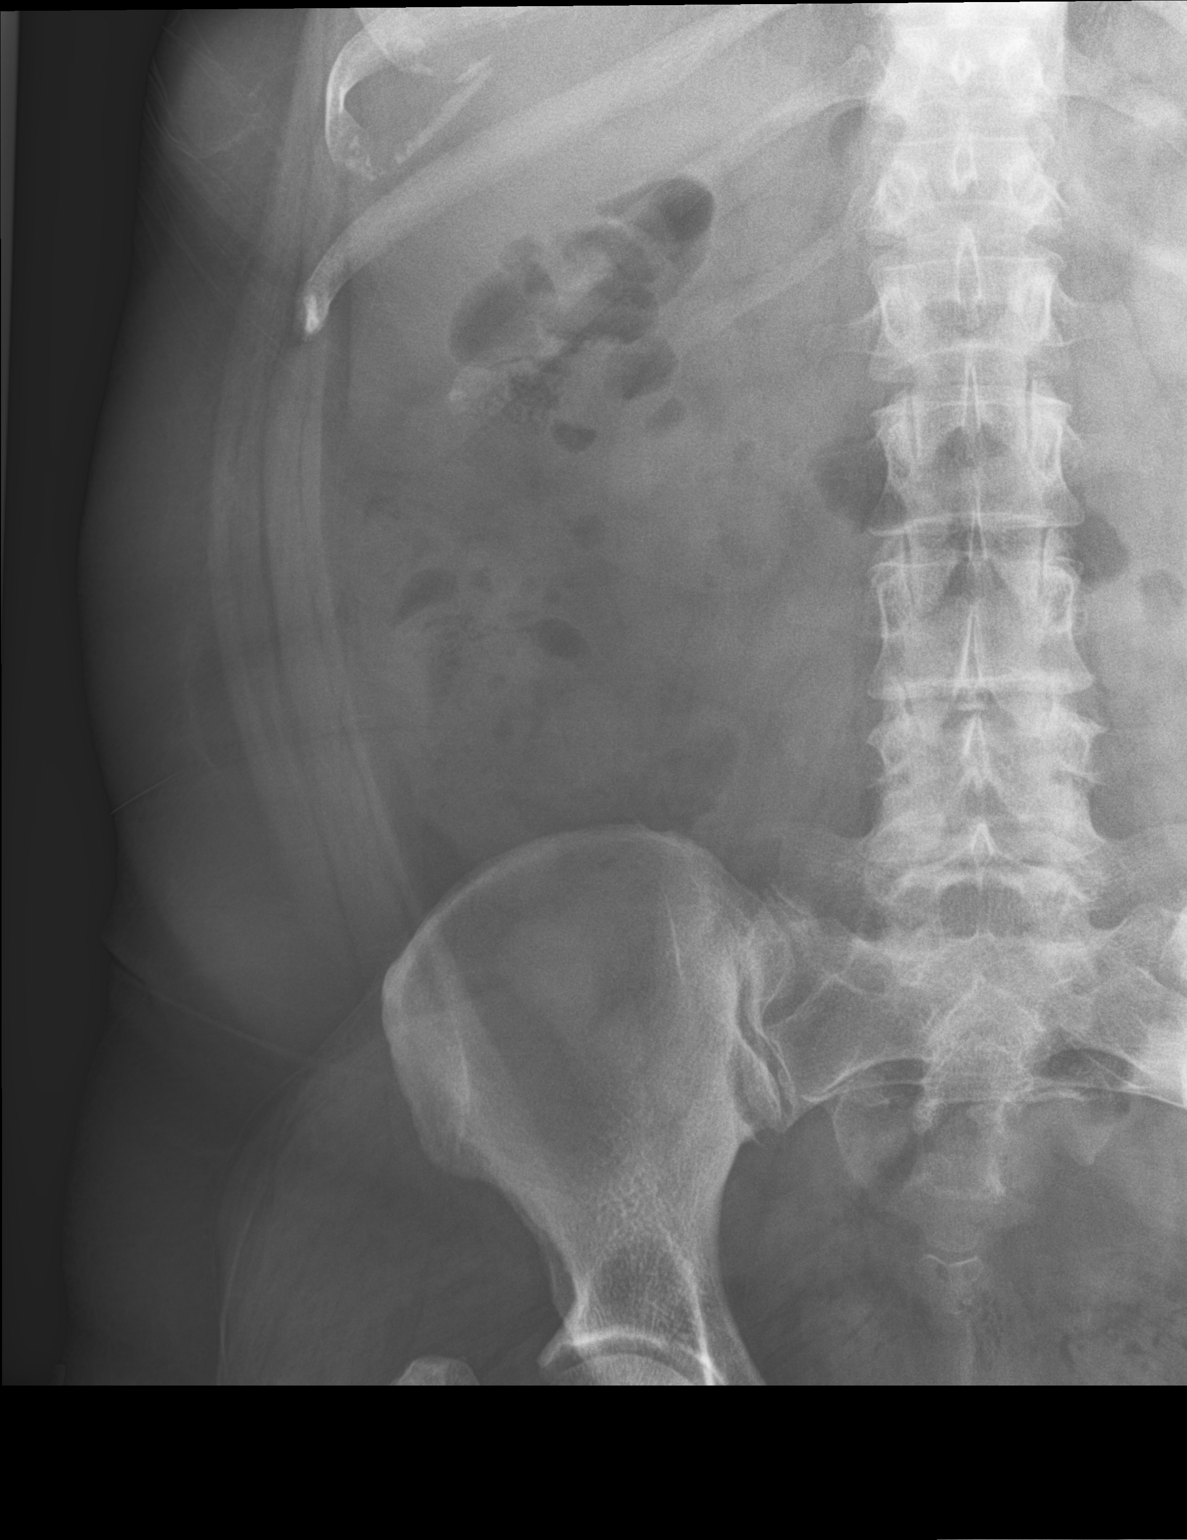

[abdomen kub (3 of 3)]
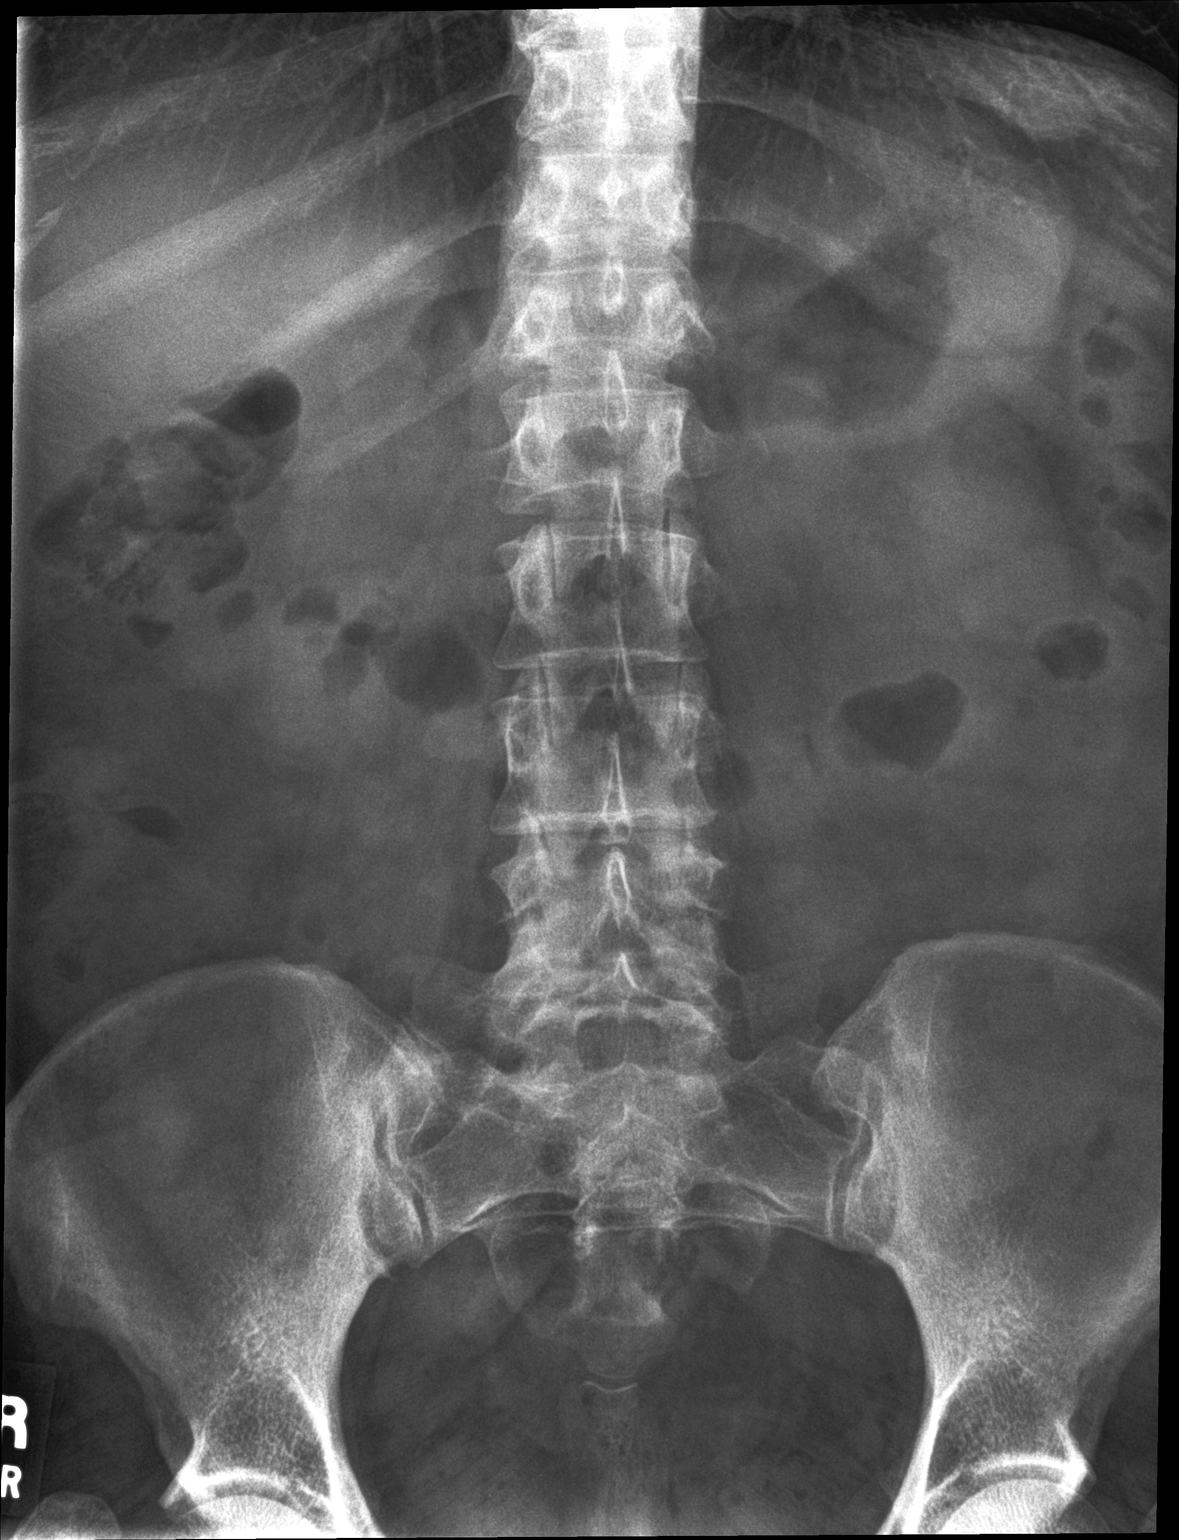

[3 of 3 positions shown; findings below may reference images not displayed]

FINDINGS: Normal bowel gas pattern. No significant stool burden. No large
abdominal or pelvic calcifications. Free air cannot be evaluated on
these images.
IMPRESSION: Negative.

## 2022-02-06 ENCOUNTER — Emergency Department (HOSPITAL_BASED_OUTPATIENT_CLINIC_OR_DEPARTMENT_OTHER)
Admission: EM | Admit: 2022-02-06 | Discharge: 2022-02-06 | Disposition: A | Discharge status: Home / Self Care | Payer: BLUE CROSS/BLUE SHIELD | Attending: Emergency Medicine | Admitting: Emergency Medicine

## 2022-02-06 ENCOUNTER — Encounter (HOSPITAL_BASED_OUTPATIENT_CLINIC_OR_DEPARTMENT_OTHER): Payer: Self-pay

## 2022-02-06 ENCOUNTER — Emergency Department (HOSPITAL_BASED_OUTPATIENT_CLINIC_OR_DEPARTMENT_OTHER): Payer: BLUE CROSS/BLUE SHIELD

## 2022-02-06 ENCOUNTER — Other Ambulatory Visit: Payer: Self-pay

## 2022-02-06 DIAGNOSIS — K529 Noninfective gastroenteritis and colitis, unspecified: Secondary | ICD-10-CM | POA: Insufficient documentation

## 2022-02-06 DIAGNOSIS — E119 Type 2 diabetes mellitus without complications: Secondary | ICD-10-CM | POA: Insufficient documentation

## 2022-02-06 DIAGNOSIS — Z7984 Long term (current) use of oral hypoglycemic drugs: Secondary | ICD-10-CM | POA: Insufficient documentation

## 2022-02-06 DIAGNOSIS — R1013 Epigastric pain: Secondary | ICD-10-CM | POA: Diagnosis present

## 2022-02-06 LAB — CBC
HCT: 44.9 % (ref 36.0–46.0)
Hemoglobin: 15.2 g/dL — ABNORMAL HIGH (ref 12.0–15.0)
MCH: 31.7 pg (ref 26.0–34.0)
MCHC: 33.9 g/dL (ref 30.0–36.0)
MCV: 93.7 fL (ref 80.0–100.0)
Platelets: 279 10*3/uL (ref 150–400)
RBC: 4.79 MIL/uL (ref 3.87–5.11)
RDW: 13.8 % (ref 11.5–15.5)
WBC: 13.7 10*3/uL — ABNORMAL HIGH (ref 4.0–10.5)
nRBC: 0 % (ref 0.0–0.2)

## 2022-02-06 LAB — URINALYSIS, ROUTINE W REFLEX MICROSCOPIC
Bilirubin Urine: NEGATIVE
Glucose, UA: >1000 mg/dL — AB
Hgb urine dipstick: NEGATIVE
Ketones, ur: 15 mg/dL — AB
Leukocytes,Ua: NEGATIVE
Nitrite: NEGATIVE
Protein, ur: NEGATIVE mg/dL
Specific Gravity, Urine: >1.046 — ABNORMAL HIGH (ref 1.005–1.030)
pH: 5.5 (ref 5.0–8.0)

## 2022-02-06 LAB — COMPREHENSIVE METABOLIC PANEL
ALT: 27 U/L (ref 0–44)
AST: 14 U/L — ABNORMAL LOW (ref 15–41)
Albumin: 4.2 g/dL (ref 3.5–5.0)
Alkaline Phosphatase: 44 U/L (ref 38–126)
Anion gap: 14 (ref 5–15)
BUN: 17 mg/dL (ref 6–20)
CO2: 21 mmol/L — ABNORMAL LOW (ref 22–32)
Calcium: 9 mg/dL (ref 8.9–10.3)
Chloride: 103 mmol/L (ref 98–111)
Creatinine, Ser: 0.72 mg/dL (ref 0.44–1.00)
GFR, Estimated: >60 mL/min (ref >60)
Glucose, Bld: 267 mg/dL — ABNORMAL HIGH (ref 70–99)
Potassium: 4.2 mmol/L (ref 3.5–5.1)
Sodium: 138 mmol/L (ref 135–145)
Total Bilirubin: 1 mg/dL (ref 0.3–1.2)
Total Protein: 7.2 g/dL (ref 6.5–8.1)

## 2022-02-06 LAB — TROPONIN I (HIGH SENSITIVITY): Troponin I (High Sensitivity): 3 ng/L (ref <18)

## 2022-02-06 LAB — PREGNANCY, URINE: Preg Test, Ur: NEGATIVE

## 2022-02-06 LAB — LIPASE, BLOOD: Lipase: 26 U/L (ref 11–51)

## 2022-02-06 MED ORDER — LACTATED RINGERS IV BOLUS
1000.0000 mL | Freq: Once | INTRAVENOUS | Status: AC
  Administered 2022-02-06: 1000 mL via INTRAVENOUS

## 2022-02-06 MED ORDER — FENTANYL CITRATE PF 50 MCG/ML IJ SOSY
50.0000 ug | PREFILLED_SYRINGE | Freq: Once | INTRAMUSCULAR | Status: AC
  Administered 2022-02-06: 50 ug via INTRAVENOUS
  Filled 2022-02-06: qty 1

## 2022-02-06 MED ORDER — FAMOTIDINE IN NACL 20-0.9 MG/50ML-% IV SOLN
20.0000 mg | Freq: Once | INTRAVENOUS | Status: AC
  Administered 2022-02-06: 20 mg via INTRAVENOUS
  Filled 2022-02-06: qty 50

## 2022-02-06 MED ORDER — ONDANSETRON 4 MG PO TBDP: 4.0000 mg | ORAL_TABLET | Freq: Three times a day (TID) | ORAL | 0 refills | Status: AC | PRN

## 2022-02-06 MED ORDER — DROPERIDOL 2.5 MG/ML IJ SOLN
2.5000 mg | Freq: Once | INTRAMUSCULAR | Status: AC
  Administered 2022-02-06: 2.5 mg via INTRAVENOUS
  Filled 2022-02-06: qty 2

## 2022-10-31 ENCOUNTER — Encounter (HOSPITAL_BASED_OUTPATIENT_CLINIC_OR_DEPARTMENT_OTHER): Payer: Self-pay

## 2022-10-31 ENCOUNTER — Emergency Department (HOSPITAL_BASED_OUTPATIENT_CLINIC_OR_DEPARTMENT_OTHER): Payer: Medicaid Other | Admitting: Radiology

## 2022-10-31 ENCOUNTER — Other Ambulatory Visit: Payer: Self-pay

## 2022-10-31 ENCOUNTER — Emergency Department (HOSPITAL_BASED_OUTPATIENT_CLINIC_OR_DEPARTMENT_OTHER)
Admission: EM | Admit: 2022-10-31 | Discharge: 2022-10-31 | Disposition: A | Discharge status: Home / Self Care | Payer: Medicaid Other | Attending: Emergency Medicine | Admitting: Emergency Medicine

## 2022-10-31 DIAGNOSIS — K219 Gastro-esophageal reflux disease without esophagitis: Secondary | ICD-10-CM | POA: Diagnosis not present

## 2022-10-31 DIAGNOSIS — R002 Palpitations: Secondary | ICD-10-CM | POA: Diagnosis present

## 2022-10-31 LAB — CBC
HCT: 40.1 % (ref 36.0–46.0)
Hemoglobin: 13.8 g/dL (ref 12.0–15.0)
MCH: 31.3 pg (ref 26.0–34.0)
MCHC: 34.4 g/dL (ref 30.0–36.0)
MCV: 90.9 fL (ref 80.0–100.0)
Platelets: 237 10*3/uL (ref 150–400)
RBC: 4.41 MIL/uL (ref 3.87–5.11)
RDW: 13 % (ref 11.5–15.5)
WBC: 9.1 10*3/uL (ref 4.0–10.5)
nRBC: 0 % (ref 0.0–0.2)

## 2022-10-31 LAB — BASIC METABOLIC PANEL
Anion gap: 11 (ref 5–15)
BUN: 13 mg/dL (ref 6–20)
CO2: 22 mmol/L (ref 22–32)
Calcium: 9.5 mg/dL (ref 8.9–10.3)
Chloride: 104 mmol/L (ref 98–111)
Creatinine, Ser: 0.68 mg/dL (ref 0.44–1.00)
GFR, Estimated: >60 mL/min (ref >60)
Glucose, Bld: 122 mg/dL — ABNORMAL HIGH (ref 70–99)
Potassium: 3.9 mmol/L (ref 3.5–5.1)
Sodium: 137 mmol/L (ref 135–145)

## 2022-10-31 LAB — TROPONIN I (HIGH SENSITIVITY)
Troponin I (High Sensitivity): 3 ng/L (ref <18)
Troponin I (High Sensitivity): <2 ng/L (ref <18)

## 2022-10-31 MED ORDER — SUCRALFATE 1 GM/10ML PO SUSP
1.0000 g | Freq: Once | ORAL | Status: AC
  Administered 2022-10-31: 1 g via ORAL
  Filled 2022-10-31: qty 10

## 2022-10-31 NOTE — ED Notes (Signed)
Ambulated pt with O2 sensor on her ear and she stayed consistently between 97-98%

## 2022-10-31 NOTE — ED Provider Notes (Signed)
[place] EMERGENCY DEPARTMENT AT [facility] Provider Note   CSN: [id number] Arrival date & time: 10/31/22  1538     History  Chief Complaint  Patient presents with   Chest Pain    Debbie Hebert is a 52 y.o. female PMHx GERD who presents to ED concerned of palpitations x3 days. Also endorsing mild SOB, and mild nausea when palpitations occur. Patient presented with these symptoms to UC 2 days ago and was discharged with anxiety medication (escitalopram) which has not helped. Patient stating that her anxiety has been increased recently.  Denies fever, cough, abdominal pain, vomiting, diarrhea, syncope. Denies hx DVT/PE, recent surgery/trauma, recent immobilization.  Spanish interpreter used.   Chest Pain      Home Medications Prior to Admission medications   Medication Sig Start Date End Date Taking? Authorizing Provider  amoxicillin-clavulanate (AUGMENTIN) 875-125 MG tablet Take 1 tablet by mouth 2 (two) times daily. 04/12/18   Shade Flood, MD  metFORMIN (GLUCOPHAGE) 500 MG tablet TAKE 1 TABLET (500 MG TOTAL) BY MOUTH 2 (TWO) TIMES DAILY WITH A MEAL. REPORTED ON 05/05/2015 04/27/16   Ok Edwards, MD  pravastatin (PRAVACHOL) 20 MG tablet TAKE 1 TABLET BY MOUTH EVERYDAY AT BEDTIME 01/06/18   [provider]  vitamin E 400 UNIT capsule Take 400 Units by mouth daily.    [provider]      Allergies    Patient has no known allergies.    Review of Systems   Review of Systems  Cardiovascular:  Positive for chest pain.    Physical Exam Updated Vital Signs BP 117/75 (BP Location: Left Arm)   Pulse 60   Temp 98.3 F (36.8 C) (Oral)   Resp 16   Ht 5\' 3"  (1.6 m)   Wt 93.9 kg   SpO2 97%   BMI 36.67 kg/m  Physical Exam Vitals and nursing note reviewed.  Constitutional:      General: She is not in acute distress.    Appearance: She is not ill-appearing, toxic-appearing or diaphoretic.  HENT:     Head: Normocephalic and  atraumatic.     Mouth/Throat:     Mouth: Mucous membranes are moist.     Pharynx: No oropharyngeal exudate or posterior oropharyngeal erythema.  Eyes:     General: No scleral icterus.       Right eye: No discharge.        Left eye: No discharge.     Conjunctiva/sclera: Conjunctivae normal.  Cardiovascular:     Rate and Rhythm: Normal rate and regular rhythm.     Pulses: Normal pulses.          Radial pulses are 2+ on the right side and 2+ on the left side.       Dorsalis pedis pulses are 2+ on the right side and 2+ on the left side.     Heart sounds: Normal heart sounds. No murmur heard. Pulmonary:     Effort: Pulmonary effort is normal. No respiratory distress.     Breath sounds: Normal breath sounds. No wheezing, rhonchi or rales.     Comments: O2 sat >97% while ambulating on RA. Ambulatory HR <70 BPM. Abdominal:     General: Bowel sounds are normal.     Palpations: Abdomen is soft. There is no mass.     Tenderness: There is no abdominal tenderness.  Musculoskeletal:     Right lower leg: No edema.     Left lower leg: No edema.  Skin:  General: Skin is warm and dry.     Findings: No rash.  Neurological:     General: No focal deficit present.     Mental Status: She is alert. Mental status is at baseline.  Psychiatric:        Mood and Affect: Mood normal.     ED Results / Procedures / Treatments   Labs (all labs ordered are listed, but only abnormal results are displayed) Labs Reviewed  BASIC METABOLIC PANEL - Abnormal; Notable for the following components:      Result Value   Glucose, Bld 122 (*)    All other components within normal limits  CBC  TROPONIN I (HIGH SENSITIVITY)  TROPONIN I (HIGH SENSITIVITY)    EKG EKG Interpretation  Date/Time:  Monday October 31 2022 15:58:15 EDT Ventricular Rate:  74 PR Interval:  144 QRS Duration: 82 QT Interval:  392 QTC Calculation: 435 R Axis:   78 Text Interpretation: Normal sinus rhythm Normal ECG When compared with  ECG of 30-May-2015 23:05, No significant change was found Confirmed by Gwyneth Sprout ([id number]) on 10/31/2022 5:56:35 PM  Radiology DG Chest 2 View  Result Date: 10/31/2022 CLINICAL DATA:  Chest pain.  History of diabetes EXAM: CHEST - 2 VIEW COMPARISON:  None Available. FINDINGS: Normal mediastinum and cardiac silhouette. Normal pulmonary vasculature. No evidence of effusion, infiltrate, or pneumothorax. No acute bony abnormality. IMPRESSION: No acute cardiopulmonary process. Electronically Signed   By: Genevive Bi M.D.   On: 10/31/2022 16:22    Procedures Procedures    Medications Ordered in ED Medications  sucralfate (CARAFATE) 1 GM/10ML suspension 1 g (1 g Oral Given 10/31/22 1913)    ED Course/ Medical Decision Making/ A&P                             Medical Decision Making Amount and/or Complexity of Data Reviewed Labs: ordered. Radiology: ordered.  Risk Prescription drug management.   This patient presents to the ED for concern of chest pain, this involves an extensive number of treatment options, and is a complaint that carries with it a high risk of complications and morbidity.  The differential diagnosis includes acute coronary syndrome, congestive heart failure, pericarditis, pneumonia, pulmonary embolism, tension pneumothorax, esophageal rupture, aortic dissection, cardiac tamponade, musculoskeletal   Co morbidities that complicate the patient evaluation  HTN, GERD, anxiety   Lab Tests:  I Ordered, and personally interpreted labs.  The pertinent results include:  - Troponin: initial and repeat troponin within normal limits - BMP: no concern for electrolyte abnormality; no concern for kidney damage - CBC: No concern for anemia or leukocytosis    Imaging Studies ordered:  I ordered imaging studies including  -chest xray: No acute cardiopulmonary disease I independently visualized and interpreted imaging  I agree with the radiologist  interpretation   Cardiac Monitoring: / EKG:  The patient was maintained on a cardiac monitor.  I personally viewed and interpreted the cardiac monitored which showed an underlying rhythm of: Sinus rhythm without any acute ST changes or arrhythmias   Problem List / ED Course / Critical interventions / Medication management  Patient presented for palpitations.  Physical exam unremarkable.  Patient stating that palpitations are intermittent and sometimes associated with mild SOB and nausea.  Troponins within normal limits and flat.  EKG showing NSR.  PERC score 0.  Patient afebrile without tachycardia or hypoxia.  Patient with oxygen saturation between 94-96% on room air while  oxygen probe was on finger with nail polish. When probe was moved to patient's earlobe, O2 sats were >97%.  Patient ambulated without concerns for hypoxia or tachycardia.  Provided patient with Carafate-which she states helped resolved her symptoms. Given history, physical exam, and labs today, I believe patient's symptoms are due to acid reflux. During my interview with patient, she endorsed that she was currently feeling palpitations in that moment.  Patient was on cardiac monitor at that time which was not showing arrhythmias, ST elevations, or any other cardiac abnormality. Patient also stating that these symptoms started after taking ozympic last week. Educated patient to follow up with PCP to discuss her ozympic prescription and follow up on her GERD. Educated patient that escitalopram takes 2 to 4 weeks to start working-I recommended following up with her primary care provider. I have reviewed the patients home medicines and have made adjustments as needed Patient was given return precautions. Patient stable for discharge at this time.  Patient verbalized understanding of plan.  Ddx:  These are considered less likely due to history of present illness and physical exam findings.  Acute coronary syndrome: EKG and troponins  within normal limits  Congestive heart failure: patient denies orthopnea, cough, and leg edema Pericarditis: pain is not positional and patient denies orthopnea and recent illness Pneumonia: lungs are clear to auscultation bilaterally Pulmonary embolism: no recent surgeries, blood clot hx, hemoptysis, cancer hx, vitals stable Pneumothorax: lungs are clear to auscultation bilaterally Esophageal rupture: patient denies vomiting/heavy drinking Aortic dissection: vital signs are stable, no variation in pulse pressure Cardiac tamponade: absence of hypotension, JVD, and muffled heart sounds   Social Determinants of Health:  Foreign language           Final Clinical Impression(s) / ED Diagnoses Final diagnoses:  Palpitation  Gastroesophageal reflux disease, unspecified whether esophagitis present    Rx / DC Orders ED Discharge Orders     None         Dorthy Cooler, [place] 11/02/22 1558    Gwyneth Sprout, MD 11/04/22 1757

## 2022-10-31 NOTE — ED Triage Notes (Signed)
Patient here POV from Home.  Endorses Mid CP that radiates to left Side that began Friday. Mild SOB. Some Palpitations and more tight in nature.   No Known Fever or Cough. Some nausea. Went to [facility] Friday and was given medication for Anxiety which has been not very effective.   NAD Noted during Triage. A&Ox4. GCS 15. Ambulatory.

## 2022-10-31 NOTE — ED Notes (Signed)
Patient ambulated around unit. Lowest SpO2 94%. HR 75-80. No distress noted.

## 2022-10-31 NOTE — Discharge Instructions (Addendum)
It was a pleasure caring for you today.  You can continue taking your omeprazole as prescribed.  I recommend following up with your primary care provider within this next week.  Seek emergency care if experiencing any new or worsening symptoms.

## 2022-10-31 NOTE — ED Notes (Signed)
Family in room who spoke english and d/c instructions printed in spanish. PA in room at time of discharge and instructions given with family verbalizing understanding

## 2023-02-14 ENCOUNTER — Emergency Department (HOSPITAL_BASED_OUTPATIENT_CLINIC_OR_DEPARTMENT_OTHER)
Admission: EM | Admit: 2023-02-14 | Discharge: 2023-02-14 | Disposition: A | Discharge status: Home / Self Care | Payer: Medicaid Other | Attending: Emergency Medicine | Admitting: Emergency Medicine

## 2023-02-14 ENCOUNTER — Encounter (HOSPITAL_BASED_OUTPATIENT_CLINIC_OR_DEPARTMENT_OTHER): Payer: Self-pay | Admitting: Urology

## 2023-02-14 ENCOUNTER — Other Ambulatory Visit: Payer: Self-pay

## 2023-02-14 DIAGNOSIS — E119 Type 2 diabetes mellitus without complications: Secondary | ICD-10-CM | POA: Diagnosis not present

## 2023-02-14 DIAGNOSIS — K219 Gastro-esophageal reflux disease without esophagitis: Secondary | ICD-10-CM | POA: Diagnosis present

## 2023-02-14 DIAGNOSIS — Z79899 Other long term (current) drug therapy: Secondary | ICD-10-CM | POA: Insufficient documentation

## 2023-02-14 DIAGNOSIS — I10 Essential (primary) hypertension: Secondary | ICD-10-CM | POA: Insufficient documentation

## 2023-02-14 DIAGNOSIS — Z7984 Long term (current) use of oral hypoglycemic drugs: Secondary | ICD-10-CM | POA: Insufficient documentation

## 2023-02-14 LAB — COMPREHENSIVE METABOLIC PANEL
ALT: 23 U/L (ref 0–44)
AST: 14 U/L — ABNORMAL LOW (ref 15–41)
Albumin: 4.3 g/dL (ref 3.5–5.0)
Alkaline Phosphatase: 59 U/L (ref 38–126)
Anion gap: 8 (ref 5–15)
BUN: 15 mg/dL (ref 6–20)
CO2: 25 mmol/L (ref 22–32)
Calcium: 9.6 mg/dL (ref 8.9–10.3)
Chloride: 106 mmol/L (ref 98–111)
Creatinine, Ser: 0.86 mg/dL (ref 0.44–1.00)
GFR, Estimated: >60 mL/min (ref >60)
Glucose, Bld: 169 mg/dL — ABNORMAL HIGH (ref 70–99)
Potassium: 4.5 mmol/L (ref 3.5–5.1)
Sodium: 139 mmol/L (ref 135–145)
Total Bilirubin: 0.6 mg/dL (ref 0.3–1.2)
Total Protein: 7.5 g/dL (ref 6.5–8.1)

## 2023-02-14 LAB — CBC WITH DIFFERENTIAL/PLATELET
Abs Immature Granulocytes: 0.02 10*3/uL (ref 0.00–0.07)
Basophils Absolute: 0.1 10*3/uL (ref 0.0–0.1)
Basophils Relative: 1 %
Eosinophils Absolute: 0 10*3/uL (ref 0.0–0.5)
Eosinophils Relative: 0 %
HCT: 40.6 % (ref 36.0–46.0)
Hemoglobin: 13.7 g/dL (ref 12.0–15.0)
Immature Granulocytes: 0 %
Lymphocytes Relative: 22 %
Lymphs Abs: 2.1 10*3/uL (ref 0.7–4.0)
MCH: 30.9 pg (ref 26.0–34.0)
MCHC: 33.7 g/dL (ref 30.0–36.0)
MCV: 91.4 fL (ref 80.0–100.0)
Monocytes Absolute: 0.4 10*3/uL (ref 0.1–1.0)
Monocytes Relative: 4 %
Neutro Abs: 7.2 10*3/uL (ref 1.7–7.7)
Neutrophils Relative %: 73 %
Platelets: 279 10*3/uL (ref 150–400)
RBC: 4.44 MIL/uL (ref 3.87–5.11)
RDW: 14 % (ref 11.5–15.5)
WBC: 9.9 10*3/uL (ref 4.0–10.5)
nRBC: 0 % (ref 0.0–0.2)

## 2023-02-14 LAB — TROPONIN I (HIGH SENSITIVITY): Troponin I (High Sensitivity): <2 ng/L (ref <18)

## 2023-02-14 LAB — LIPASE, BLOOD: Lipase: 33 U/L (ref 11–51)

## 2023-02-14 MED ORDER — ALUM & MAG HYDROXIDE-SIMETH 200-200-20 MG/5ML PO SUSP
30.0000 mL | Freq: Once | ORAL | Status: AC
  Administered 2023-02-14: 30 mL via ORAL
  Filled 2023-02-14: qty 30

## 2023-02-14 MED ORDER — FAMOTIDINE 20 MG PO TABS: 20.0000 mg | ORAL_TABLET | Freq: Every day | ORAL | 0 refills | Status: AC

## 2023-02-14 MED ORDER — ONDANSETRON 4 MG PO TBDP
4.0000 mg | ORAL_TABLET | Freq: Once | ORAL | Status: AC
  Administered 2023-02-14: 4 mg via ORAL
  Filled 2023-02-14: qty 1

## 2023-02-14 MED ORDER — FAMOTIDINE 20 MG PO TABS
20.0000 mg | ORAL_TABLET | Freq: Once | ORAL | Status: AC
  Administered 2023-02-14: 20 mg via ORAL
  Filled 2023-02-14: qty 1

## 2023-02-14 MED ORDER — MAALOX MAX 400-400-40 MG/5ML PO SUSP: 15.0000 mL | Freq: Four times a day (QID) | ORAL | 0 refills | Status: AC | PRN

## 2023-02-14 NOTE — ED Notes (Signed)
Pt alert, NAD, calm, interactive, resps e/u, speaking clearly. Family at [facility]. Steady gait.

## 2023-02-14 NOTE — ED Triage Notes (Signed)
Burning chest pressure x 10 days, N/V earlier today but better now  H/o acid reflux  Does not take maintenance med

## 2023-02-14 NOTE — Discharge Instructions (Signed)
You were seen in the emergency department for your chest pain.  Your workup here showed no signs of heart attack or stress on your heart and no abnormalities with your liver, kidneys or pancreas.  It is most likely that your symptoms are from acid reflux.  I have started you on an antacid called Pepcid and you should take this daily for at least the next 2 weeks to see if this helps with your symptoms.  You can take Maalox as needed for additional pain and continue to use your home nausea medicine as needed.  You should follow-up with your primary doctor in the next few days to have your symptoms rechecked.  You should return to the emergency department for significantly worsening pain, severe shortness of breath, repetitive vomiting despite the nausea medicine or any other new or concerning symptoms.  Lo atendieron en el departamento de emergencias por su dolor en el pecho.  Su examen aqu no mostr signos de ataque cardaco o estrs en su corazn ni anomalas en su hgado, riones o pncreas.  Lo ms probable es que sus sntomas se deban al reflujo cido.  Le comenc a tomar un anticido llamado Pepcid y debe tomarlo diariamente durante al menos las prximas 2 semanas para ver si esto ayuda con sus sntomas.  Puede tomar Maalox segn sea necesario para el dolor adicional y continuar usando su medicamento casero para las nuseas segn sea necesario.  Debe realizar un seguimiento con su mdico de atencin primaria en los prximos das para que vuelvan a Chief Operating Officer sus sntomas.  Debe regresar al departamento de emergencias si el dolor empeora significativamente, si hay dificultad para respirar intensa, vmitos repetitivos a pesar del medicamento para las nuseas o cualquier otro sntoma nuevo o preocupante.

## 2023-02-14 NOTE — ED Provider Notes (Signed)
[place] EMERGENCY DEPARTMENT AT [facility] Provider Note   CSN: [id number] Arrival date & time: 02/14/23  1313     History  Chief Complaint  Patient presents with   Gastroesophageal Reflux    Debbie Hebert is a 52 y.o. female.  Patient is a 52 year old female with a past medical history of hypertension, diabetes and GERD presenting to the emergency department with chest pain.  The patient states that she has been feeling a burning pain across her upper chest on and off for the last 10 days.  She states that it gets worse with eating.  She states that today she had associated nausea and vomited once.  She states that she does have some mild shortness of breath on exertion.  Denies any fevers or lower extremity swelling.  Denies associated abdominal pain.  She is not currently on any antacids.  Denies any history of blood clots, recent hospitalizations or surgery, recent long travel in the car or plane, hormone use or cancer history.  She was evaluated at urgent care this morning where she had a chest x-ray performed and then was recommended to come to the emergency department for further evaluation.  The history is provided by the patient and a relative. No language interpreter was used (Declined).  Gastroesophageal Reflux       Home Medications Prior to Admission medications   Medication Sig Start Date End Date Taking? Authorizing Provider  alum & mag hydroxide-simeth (MAALOX MAX) 400-400-40 MG/5ML suspension Take 15 mLs by mouth every 6 (six) hours as needed for indigestion. 02/14/23  Yes Theresia Lo, Benetta Spar K, DO  famotidine (PEPCID) 20 MG tablet Take 1 tablet (20 mg total) by mouth daily. 02/14/23  Yes Theresia Lo, Benetta Spar K, DO  amoxicillin-clavulanate (AUGMENTIN) 875-125 MG tablet Take 1 tablet by mouth 2 (two) times daily. 04/12/18   Shade Flood, MD  metFORMIN (GLUCOPHAGE) 500 MG tablet TAKE 1 TABLET (500 MG TOTAL) BY MOUTH 2 (TWO) TIMES DAILY WITH A MEAL.  REPORTED ON 05/05/2015 04/27/16   Ok Edwards, MD  pravastatin (PRAVACHOL) 20 MG tablet TAKE 1 TABLET BY MOUTH EVERYDAY AT BEDTIME 01/06/18   [provider]  vitamin E 400 UNIT capsule Take 400 Units by mouth daily.    [provider]      Allergies    Patient has no known allergies.    Review of Systems   Review of Systems  Physical Exam Updated Vital Signs BP 110/79 (BP Location: Right Arm)   Pulse 87   Temp 98 F (36.7 C) (Oral)   Resp 18   Ht 5\' 3"  (1.6 m)   Wt 93.9 kg   SpO2 97%   BMI 36.67 kg/m  Physical Exam Vitals and nursing note reviewed.  Constitutional:      General: She is not in acute distress.    Appearance: Normal appearance. She is obese.  HENT:     Head: Normocephalic and atraumatic.     Nose: Nose normal.     Mouth/Throat:     Mouth: Mucous membranes are moist.     Pharynx: Oropharynx is clear.  Eyes:     Extraocular Movements: Extraocular movements intact.     Conjunctiva/sclera: Conjunctivae normal.  Cardiovascular:     Rate and Rhythm: Normal rate and regular rhythm.     Heart sounds: Normal heart sounds.     Comments: No chest wall tenderness to palpation Pulmonary:     Effort: Pulmonary effort is normal.  Breath sounds: Normal breath sounds.  Abdominal:     General: Abdomen is flat.     Palpations: Abdomen is soft.     Tenderness: There is no abdominal tenderness.  Musculoskeletal:        General: Normal range of motion.     Cervical back: Normal range of motion.     Right lower leg: No edema.     Left lower leg: No edema.  Skin:    General: Skin is warm and dry.  Neurological:     General: No focal deficit present.     Mental Status: She is alert and oriented to person, place, and time.  Psychiatric:        Mood and Affect: Mood normal.        Behavior: Behavior normal.     ED Results / Procedures / Treatments   Labs (all labs ordered are listed, but only abnormal results are displayed) Labs  Reviewed  COMPREHENSIVE METABOLIC PANEL - Abnormal; Notable for the following components:      Result Value   Glucose, Bld 169 (*)    AST 14 (*)    All other components within normal limits  CBC WITH DIFFERENTIAL/PLATELET  LIPASE, BLOOD  TROPONIN I (HIGH SENSITIVITY)    EKG EKG Interpretation Date/Time:  Tuesday February 14 2023 13:26:14 EDT Ventricular Rate:  86 PR Interval:  136 QRS Duration:  84 QT Interval:  368 QTC Calculation: 440 R Axis:   88  Text Interpretation: Normal sinus rhythm Normal ECG When compared with ECG of 31-Oct-2022 15:58, No significant change was found Confirmed by Virgina Norfolk [phone]) on 02/14/2023 1:40:23 PM  Radiology No results found.  Procedures Procedures    Medications Ordered in ED Medications  ondansetron (ZOFRAN-ODT) disintegrating tablet 4 mg (has no administration in time range)  famotidine (PEPCID) tablet 20 mg (has no administration in time range)  alum & mag hydroxide-simeth (MAALOX/MYLANTA) 200-200-20 MG/5ML suspension 30 mL (has no administration in time range)    ED Course/ Medical Decision Making/ A&P                                 Medical Decision Making This patient presents to the ED with chief complaint(s) of chest pain, nausea with pertinent past medical history of hypertension, diabetes, GERD which further complicates the presenting complaint. The complaint involves an extensive differential diagnosis and also carries with it a high risk of complications and morbidity.    The differential diagnosis includes gastritis, GERD, ACS, arrhythmia, anemia, pneumonia, pneumothorax, pulmonary edema, pleural effusion, pancreatitis, hepatitis, cholelithiasis or cholecystitis less likely as no abdominal tenderness  Additional history obtained: Additional history obtained from family Records reviewed Care Everywhere/External Records  ED Course and Reassessment: Patient's arrival she was hemodynamically stable in no acute  distress.  Was initially evaluated by triage and had EKG and labs performed.  EKG showed normal sinus rhythm without acute ischemic changes.  Labs showed normal troponin.  Symptoms ongoing for several days a single troponin is sufficient, otherwise within normal range.  I did review her chest x-ray that was performed at urgent care that showed no acute disease.  Patient is low risk by Wells criteria making a PE unlikely.  Symptoms do appear most consistent with GERD and she will be started on Pepcid and Maalox for symptomatic management was recommended close primary care follow-up.  Independent labs interpretation:  The following labs were independently interpreted: Within  normal range  Independent visualization of imaging: - N/A  Consultation: - Consulted or discussed management/test interpretation w/ external professional: N/A  Consideration for admission or further workup: Patient has no emergent conditions requiring admission or further work-up at this time and is stable for discharge home with primary care follow-up  Social Determinants of health: N/A    Risk OTC drugs. Prescription drug management.          Final Clinical Impression(s) / ED Diagnoses Final diagnoses:  Gastroesophageal reflux disease without esophagitis    Rx / DC Orders ED Discharge Orders          Ordered    famotidine (PEPCID) 20 MG tablet  Daily        02/14/23 1545    alum & mag hydroxide-simeth (MAALOX MAX) 400-400-40 MG/5ML suspension  Every 6 hours PRN        02/14/23 1545              Rexford Maus, DO 02/14/23 1546

## 2023-07-22 ENCOUNTER — Emergency Department (HOSPITAL_BASED_OUTPATIENT_CLINIC_OR_DEPARTMENT_OTHER)
Admission: EM | Admit: 2023-07-22 | Discharge: 2023-07-22 | Disposition: A | Discharge status: Home / Self Care | Attending: Emergency Medicine | Admitting: Emergency Medicine

## 2023-07-22 ENCOUNTER — Emergency Department (HOSPITAL_BASED_OUTPATIENT_CLINIC_OR_DEPARTMENT_OTHER): Admitting: Radiology

## 2023-07-22 DIAGNOSIS — F419 Anxiety disorder, unspecified: Secondary | ICD-10-CM | POA: Diagnosis not present

## 2023-07-22 DIAGNOSIS — K219 Gastro-esophageal reflux disease without esophagitis: Secondary | ICD-10-CM | POA: Diagnosis present

## 2023-07-22 HISTORY — DX: Gastro-esophageal reflux disease without esophagitis: K21.9

## 2023-07-22 HISTORY — DX: Essential (primary) hypertension: I10

## 2023-07-22 MED ORDER — LIDOCAINE VISCOUS HCL 2 % MT SOLN
15.0000 mL | Freq: Once | OROMUCOSAL | Status: AC
  Administered 2023-07-22: 15 mL via ORAL

## 2023-07-22 MED ORDER — ALUM & MAG HYDROXIDE-SIMETH 200-200-20 MG/5ML PO SUSP
30.0000 mL | Freq: Once | ORAL | Status: AC
  Administered 2023-07-22: 30 mL via ORAL
  Filled 2023-07-22: qty 30

## 2023-07-22 MED ORDER — LORAZEPAM 2 MG/ML IJ SOLN
1.0000 mg | Freq: Once | INTRAMUSCULAR | Status: AC
  Administered 2023-07-22: 1 mg via INTRAVENOUS
  Filled 2023-07-22: qty 1

## 2023-07-22 MED ORDER — LIDOCAINE VISCOUS HCL 2 % MT SOLN: 15.0000 mL | Freq: Four times a day (QID) | OROMUCOSAL | 2 refills | Status: AC | PRN
